# Patient Record
Sex: Female | Born: 1970 | Race: White | Hispanic: No | Marital: Married | Smoking: Current every day smoker
Health system: Southern US, Community
[De-identification: ages and names within clinical notes are randomized; demographics above are authoritative.]

## PROBLEM LIST (undated history)

## (undated) DIAGNOSIS — M5430 Sciatica, unspecified side: Secondary | ICD-10-CM

## (undated) HISTORY — PX: CHOLECYSTECTOMY: SHX55

---

## 2000-12-01 ENCOUNTER — Inpatient Hospital Stay (HOSPITAL_COMMUNITY): Admission: AD | Admit: 2000-12-01 | Discharge: 2000-12-01 | Payer: Self-pay | Admitting: Obstetrics & Gynecology

## 2003-08-28 ENCOUNTER — Emergency Department (HOSPITAL_COMMUNITY): Admission: EM | Admit: 2003-08-28 | Discharge: 2003-08-28 | Payer: Self-pay | Admitting: Emergency Medicine

## 2003-08-29 ENCOUNTER — Emergency Department (HOSPITAL_COMMUNITY): Admission: EM | Admit: 2003-08-29 | Discharge: 2003-08-30 | Payer: Self-pay | Admitting: Emergency Medicine

## 2005-03-26 ENCOUNTER — Emergency Department (HOSPITAL_COMMUNITY): Admission: EM | Admit: 2005-03-26 | Discharge: 2005-03-27 | Payer: Self-pay | Admitting: Emergency Medicine

## 2005-09-15 ENCOUNTER — Emergency Department (HOSPITAL_COMMUNITY): Admission: EM | Admit: 2005-09-15 | Discharge: 2005-09-15 | Payer: Self-pay | Admitting: Emergency Medicine

## 2009-01-13 ENCOUNTER — Emergency Department (HOSPITAL_COMMUNITY): Admission: EM | Admit: 2009-01-13 | Discharge: 2009-01-13 | Payer: Self-pay | Admitting: Emergency Medicine

## 2009-07-12 ENCOUNTER — Emergency Department (HOSPITAL_COMMUNITY): Admission: EM | Admit: 2009-07-12 | Discharge: 2009-07-12 | Payer: Self-pay | Admitting: Emergency Medicine

## 2011-01-02 ENCOUNTER — Emergency Department (HOSPITAL_COMMUNITY): Payer: Self-pay

## 2011-01-02 ENCOUNTER — Encounter (HOSPITAL_COMMUNITY): Payer: Self-pay

## 2011-01-02 ENCOUNTER — Emergency Department (HOSPITAL_COMMUNITY)
Admission: EM | Admit: 2011-01-02 | Discharge: 2011-01-02 | Disposition: A | Discharge status: Home / Self Care | Payer: Self-pay | Attending: Emergency Medicine | Admitting: Emergency Medicine

## 2011-01-02 DIAGNOSIS — X58XXXA Exposure to other specified factors, initial encounter: Secondary | ICD-10-CM | POA: Insufficient documentation

## 2011-01-02 DIAGNOSIS — N200 Calculus of kidney: Secondary | ICD-10-CM | POA: Insufficient documentation

## 2011-01-02 DIAGNOSIS — M549 Dorsalgia, unspecified: Secondary | ICD-10-CM | POA: Insufficient documentation

## 2011-01-02 DIAGNOSIS — R109 Unspecified abdominal pain: Secondary | ICD-10-CM | POA: Insufficient documentation

## 2011-01-02 DIAGNOSIS — S335XXA Sprain of ligaments of lumbar spine, initial encounter: Secondary | ICD-10-CM | POA: Insufficient documentation

## 2011-01-02 DIAGNOSIS — R3129 Other microscopic hematuria: Secondary | ICD-10-CM | POA: Insufficient documentation

## 2011-01-02 LAB — URINE MICROSCOPIC-ADD ON

## 2011-01-02 LAB — URINALYSIS, ROUTINE W REFLEX MICROSCOPIC
Bilirubin Urine: NEGATIVE
Glucose, UA: NEGATIVE mg/dL
Ketones, ur: NEGATIVE mg/dL
Nitrite: NEGATIVE
Protein, ur: NEGATIVE mg/dL
Specific Gravity, Urine: 1.025 (ref 1.005–1.030)
Urobilinogen, UA: 0.2 mg/dL (ref 0.0–1.0)
pH: 6 (ref 5.0–8.0)

## 2011-01-22 ENCOUNTER — Emergency Department (HOSPITAL_COMMUNITY)
Admission: EM | Admit: 2011-01-22 | Discharge: 2011-01-23 | Disposition: A | Discharge status: Home / Self Care | Payer: PRIVATE HEALTH INSURANCE | Attending: Emergency Medicine | Admitting: Emergency Medicine

## 2011-01-22 ENCOUNTER — Emergency Department (HOSPITAL_COMMUNITY): Payer: PRIVATE HEALTH INSURANCE

## 2011-01-22 DIAGNOSIS — M545 Low back pain, unspecified: Secondary | ICD-10-CM | POA: Insufficient documentation

## 2011-01-22 DIAGNOSIS — M5430 Sciatica, unspecified side: Secondary | ICD-10-CM | POA: Insufficient documentation

## 2011-01-22 DIAGNOSIS — N390 Urinary tract infection, site not specified: Secondary | ICD-10-CM | POA: Insufficient documentation

## 2011-01-22 DIAGNOSIS — N200 Calculus of kidney: Secondary | ICD-10-CM | POA: Insufficient documentation

## 2011-01-22 LAB — URINALYSIS, ROUTINE W REFLEX MICROSCOPIC
Bilirubin Urine: NEGATIVE
Glucose, UA: NEGATIVE mg/dL
Ketones, ur: NEGATIVE mg/dL
Nitrite: NEGATIVE
Protein, ur: NEGATIVE mg/dL
Specific Gravity, Urine: 1.021 (ref 1.005–1.030)
Urobilinogen, UA: 0.2 mg/dL (ref 0.0–1.0)
pH: 6 (ref 5.0–8.0)

## 2011-01-22 LAB — URINE MICROSCOPIC-ADD ON

## 2011-01-23 LAB — URINE CULTURE
Colony Count: 6000
Culture  Setup Time: 201204180121

## 2011-08-16 ENCOUNTER — Emergency Department (HOSPITAL_BASED_OUTPATIENT_CLINIC_OR_DEPARTMENT_OTHER)
Admission: EM | Admit: 2011-08-16 | Discharge: 2011-08-17 | Disposition: A | Discharge status: Home / Self Care | Payer: Self-pay | Attending: Emergency Medicine | Admitting: Emergency Medicine

## 2011-08-16 ENCOUNTER — Emergency Department (INDEPENDENT_AMBULATORY_CARE_PROVIDER_SITE_OTHER): Payer: Self-pay

## 2011-08-16 ENCOUNTER — Encounter (HOSPITAL_BASED_OUTPATIENT_CLINIC_OR_DEPARTMENT_OTHER): Payer: Self-pay | Admitting: *Deleted

## 2011-08-16 DIAGNOSIS — M549 Dorsalgia, unspecified: Secondary | ICD-10-CM | POA: Insufficient documentation

## 2011-08-16 DIAGNOSIS — N200 Calculus of kidney: Secondary | ICD-10-CM

## 2011-08-16 DIAGNOSIS — R109 Unspecified abdominal pain: Secondary | ICD-10-CM | POA: Insufficient documentation

## 2011-08-16 DIAGNOSIS — N1330 Unspecified hydronephrosis: Secondary | ICD-10-CM

## 2011-08-16 DIAGNOSIS — N201 Calculus of ureter: Secondary | ICD-10-CM

## 2011-08-16 DIAGNOSIS — R112 Nausea with vomiting, unspecified: Secondary | ICD-10-CM

## 2011-08-16 HISTORY — DX: Sciatica, unspecified side: M54.30

## 2011-08-16 LAB — URINALYSIS, ROUTINE W REFLEX MICROSCOPIC
Glucose, UA: NEGATIVE mg/dL
Ketones, ur: 15 mg/dL — AB
Leukocytes, UA: NEGATIVE
Nitrite: NEGATIVE
Protein, ur: 30 mg/dL — AB
Specific Gravity, Urine: 1.028 (ref 1.005–1.030)
Urobilinogen, UA: 0.2 mg/dL (ref 0.0–1.0)
pH: 5.5 (ref 5.0–8.0)

## 2011-08-16 LAB — URINE MICROSCOPIC-ADD ON

## 2011-08-16 LAB — PREGNANCY, URINE: Preg Test, Ur: NEGATIVE

## 2011-08-16 MED ORDER — KETOROLAC TROMETHAMINE 30 MG/ML IJ SOLN
30.0000 mg | Freq: Once | INTRAMUSCULAR | Status: AC
  Administered 2011-08-16: 30 mg via INTRAVENOUS
  Filled 2011-08-16: qty 1

## 2011-08-16 MED ORDER — SODIUM CHLORIDE 0.9 % IV SOLN
Freq: Once | INTRAVENOUS | Status: AC
  Administered 2011-08-16: 1000 mL via INTRAVENOUS

## 2011-08-16 MED ORDER — ONDANSETRON HCL 4 MG/2ML IJ SOLN
4.0000 mg | Freq: Once | INTRAMUSCULAR | Status: AC
  Administered 2011-08-16: 4 mg via INTRAVENOUS
  Filled 2011-08-16: qty 2

## 2011-08-16 MED ORDER — HYDROMORPHONE HCL PF 1 MG/ML IJ SOLN
INTRAMUSCULAR | Status: AC
  Administered 2011-08-16: 1 mg
  Filled 2011-08-16: qty 1

## 2011-08-16 MED ORDER — TAMSULOSIN HCL 0.4 MG PO CAPS: 0.4000 mg | ORAL_CAPSULE | Freq: Every day | ORAL | Status: DC

## 2011-08-16 MED ORDER — HYDROMORPHONE HCL PF 1 MG/ML IJ SOLN
1.0000 mg | Freq: Once | INTRAMUSCULAR | Status: AC
  Administered 2011-08-16: 1 mg via INTRAVENOUS
  Filled 2011-08-16: qty 1

## 2011-08-16 MED ORDER — OXYCODONE-ACETAMINOPHEN 5-325 MG PO TABS: 2.0000 | ORAL_TABLET | ORAL | Status: AC | PRN

## 2011-08-16 NOTE — ED Provider Notes (Signed)
History     CSN: [id number] Arrival date & time: 08/16/2011  6:21 PM   First MD Initiated Contact with Patient 08/16/11 1836      Chief Complaint  Patient presents with  . Abdominal Pain    (Consider location/radiation/quality/duration/timing/severity/associated sxs/prior treatment) Patient is a 40 y.o. female presenting with abdominal pain. The history is provided by the patient. No language interpreter was used.  Abdominal Pain The primary symptoms of the illness include abdominal pain. The current episode started 3 to 5 hours ago. The onset of the illness was sudden. The problem has been gradually worsening.  Associated with: previous kidney stone. The patient states that she believes she is currently not pregnant. The patient has not had a change in bowel habit. Additional symptoms associated with the illness include back pain. Symptoms associated with the illness do not include chills or constipation. Significant associated medical issues do not include PUD or inflammatory bowel disease.    Past Medical History  Diagnosis Date  . Sciatica     Past Surgical History  Procedure Date  . Cholecystectomy     No family history on file.  History  Substance Use Topics  . Smoking status: Current Everyday Smoker  . Smokeless tobacco: Not on file  . Alcohol Use: No    OB History    Grav Para Term Preterm Abortions TAB SAB Ect Mult Living                  Review of Systems  Constitutional: Negative for chills.  Gastrointestinal: Positive for abdominal pain. Negative for constipation.  Musculoskeletal: Positive for back pain.  All other systems reviewed and are negative.    Allergies  Review of patient's allergies indicates no known allergies.  Home Medications   Current Outpatient Rx  Name Route Sig Dispense Refill  . IBUPROFEN 200 MG PO TABS Oral Take 400 mg by mouth every 6 (six) hours as needed. For pain       BP 122/88  Pulse 74  Temp(Src) 98.3 F (36.8  C) (Oral)  Resp 22  SpO2 97%  Physical Exam  Nursing note and vitals reviewed. Constitutional: She appears well-developed and well-nourished.  HENT:  Head: Normocephalic and atraumatic.  Right Ear: External ear normal.  Left Ear: External ear normal.  Mouth/Throat: Oropharynx is clear and moist.  Eyes: Conjunctivae are normal. Pupils are equal, round, and reactive to light.  Neck: Normal range of motion.  Cardiovascular: Normal rate.   Pulmonary/Chest: Effort normal.  Abdominal: Soft.  Musculoskeletal: Normal range of motion.  Neurological: She is alert.  Skin: Skin is warm.  Psychiatric: She has a normal mood and affect.    ED Course  Procedures (including critical care time)  Labs Reviewed  URINALYSIS, ROUTINE W REFLEX MICROSCOPIC - Abnormal; Notable for the following:    Color, Urine AMBER (*) BIOCHEMICALS MAY BE AFFECTED BY COLOR   Appearance CLOUDY (*)    Hgb urine dipstick LARGE (*)    Bilirubin Urine SMALL (*)    Ketones, ur 15 (*)    Protein, ur 30 (*)    All other components within normal limits  PREGNANCY, URINE  URINE MICROSCOPIC-ADD ON   No results found.   No diagnosis found.    MDM  Pt given IV dilaudid x 3.  Urine shows large blood,  preg test negative    Results for orders placed during the hospital encounter of 08/16/11  URINALYSIS, ROUTINE W REFLEX MICROSCOPIC  Component Value Range   Color, Urine AMBER (*) YELLOW    Appearance CLOUDY (*) CLEAR    Specific Gravity, Urine 1.028  1.005 - 1.030    pH 5.5  5.0 - 8.0    Glucose, UA NEGATIVE  NEGATIVE (mg/dL)   Hgb urine dipstick LARGE (*) NEGATIVE    Bilirubin Urine SMALL (*) NEGATIVE    Ketones, ur 15 (*) NEGATIVE (mg/dL)   Protein, ur 30 (*) NEGATIVE (mg/dL)   Urobilinogen, UA 0.2  0.0 - 1.0 (mg/dL)   Nitrite NEGATIVE  NEGATIVE    Leukocytes, UA NEGATIVE  NEGATIVE   PREGNANCY, URINE      Component Value Range   Preg Test, Ur NEGATIVE    URINE MICROSCOPIC-ADD ON      Component  Value Range   Squamous Epithelial / LPF RARE  RARE    RBC / HPF TOO NUMEROUS TO COUNT  <3 (RBC/hpf)   Bacteria, UA RARE  RARE    Urine-Other MUCOUS PRESENT     Ct Abdomen Pelvis Wo Contrast  08/16/2011  *RADIOLOGY REPORT*  Clinical Data: Lambert Mody lower abdominal pain, nausea/vomiting, history of kidney stones  CT ABDOMEN AND PELVIS WITHOUT CONTRAST  Technique:  Multidetector CT imaging of the abdomen and pelvis was performed following the standard protocol without intravenous contrast.  Comparison: 01/22/2011  Findings: Lung bases are clear.  Unenhanced liver, spleen, pancreas, and adrenal glands within normal limits.  Status post cholecystectomy.  No intrahepatic or extrahepatic ductal dilatation.  Left perirenal edema with perinephric stranding and mild hydronephrosis.  Right kidney is unremarkable.  No renal calculi are seen.  No evidence of bowel obstruction.  Normal appendix.  No evidence of abdominal aortic aneurysm.  No abdominopelvic ascites.  No suspicious abdominopelvic lymphadenopathy.  Uterus is notable for a T-shaped IUD in satisfactory position. Bilateral ovaries are unremarkable.  3 mm calculus in the distal left ureter (series 2/image 83). Bladder is unremarkable.  Fat-containing right inguinal hernia.  Mild degenerative changes of the visualized thoracolumbar spine.  IMPRESSION: 3 mm obstructing left distal ureteral calculus.  Mild left hydronephrosis with perinephric stranding.  Original Report Authenticated By: Charline Bills, M.D.    Pt counseled on kidney stone.  Pt given torodol,  Rx for percocet and flomax.   Pt advised to follow up with her urologist.   Langston Masker, PA 08/16/11 2120  Langston Masker, [place] 08/16/11 2157

## 2011-08-16 NOTE — ED Notes (Signed)
Pt c/o sharp lower abd pain that radiates around to her back. Pt sts pain began at 1600hrs today. Pt sts nausea and diarrhea on and off since Monday.

## 2011-08-17 NOTE — ED Provider Notes (Signed)
Medical screening examination/treatment/procedure(s) were performed by non-physician practitioner and as supervising physician I was immediately available for consultation/collaboration.   Lenoir Facchini, MD 08/17/11 0048 

## 2012-07-12 IMAGING — CT CT ABD-PELV W/O CM
2 of 4 series · 17 of 46 positions shown, 19 images · non-contrast
Comparison: 01/22/2011

CLINICAL DATA: Sharp lower abdominal pain, nausea/vomiting, history
of kidney stones

CT ABDOMEN AND PELVIS WITHOUT CONTRAST
TECHNIQUE: Multidetector CT imaging of the abdomen and pelvis was
performed following the standard protocol without intravenous
contrast.

[Series 2: abd/pelvis 5.0 b31f · axial · 0.97mm/px · z∈[-522,-52]mm · 14 of 104 slices shown, 16 images]
[im 5/104  soft-tissue]
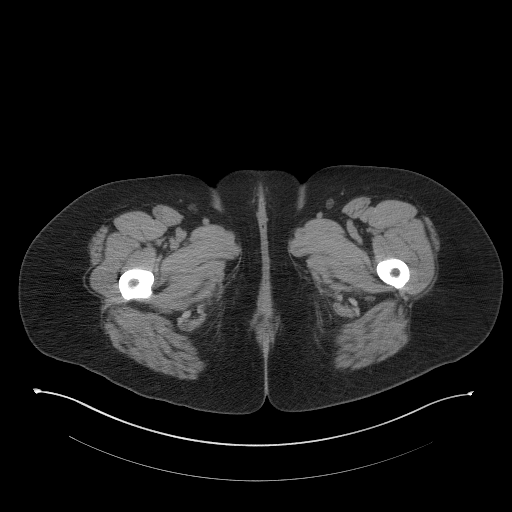
[im 5/104  bone]
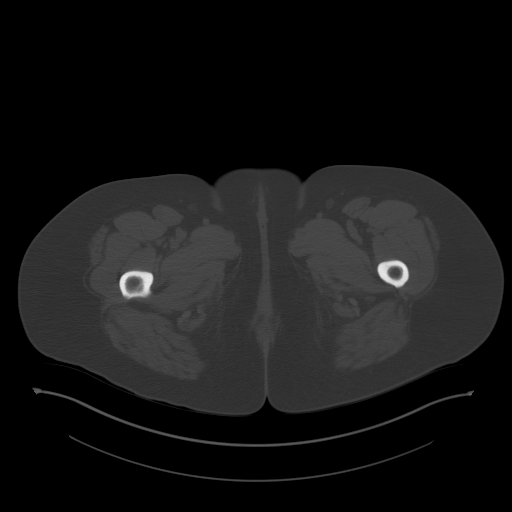
[im 14/104  soft-tissue]
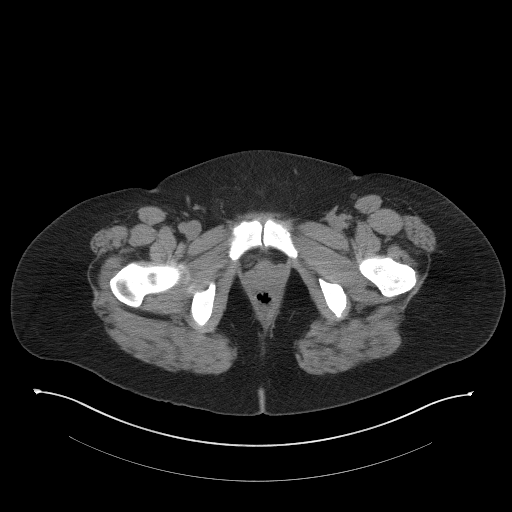
[im 18/104  soft-tissue]
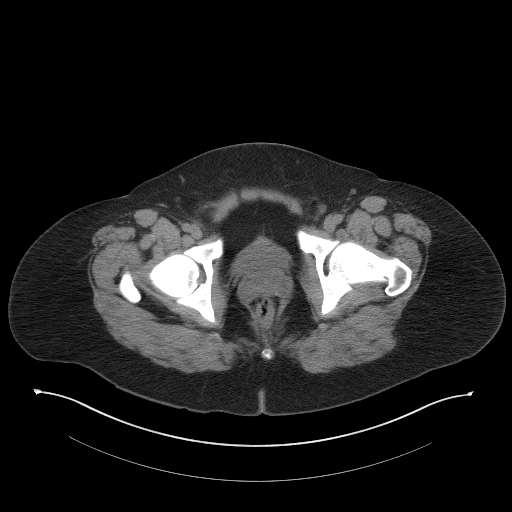
[im 27/104  soft-tissue]
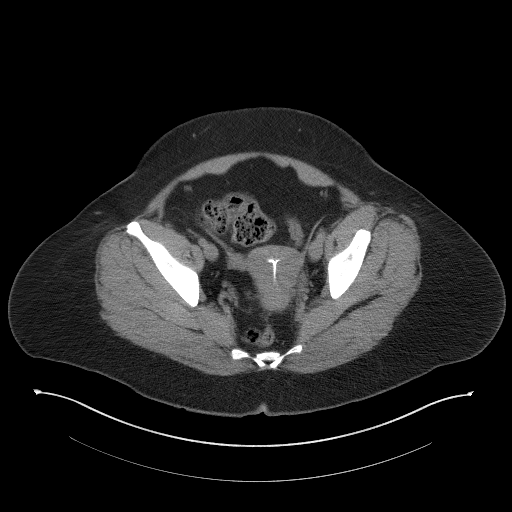
[im 36/104  soft-tissue]
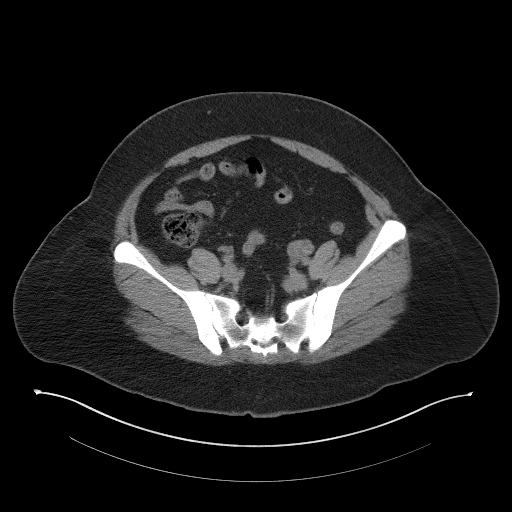
[im 41/104  soft-tissue]
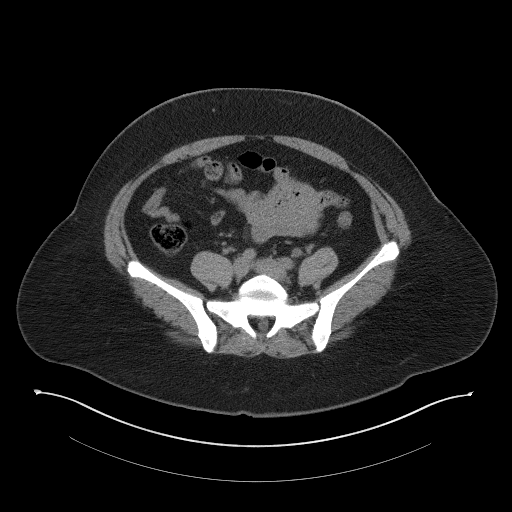
[im 50/104  soft-tissue]
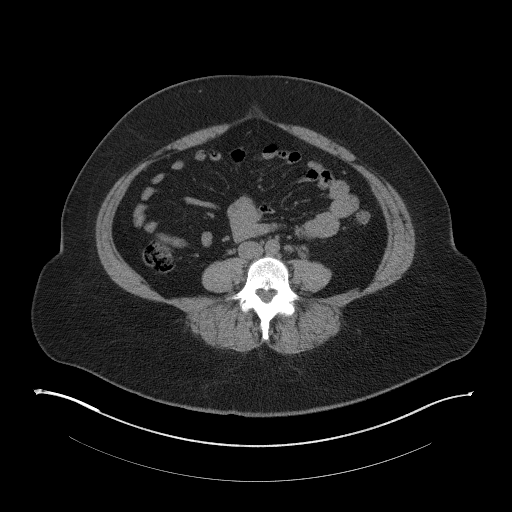
[im 54/104  soft-tissue]
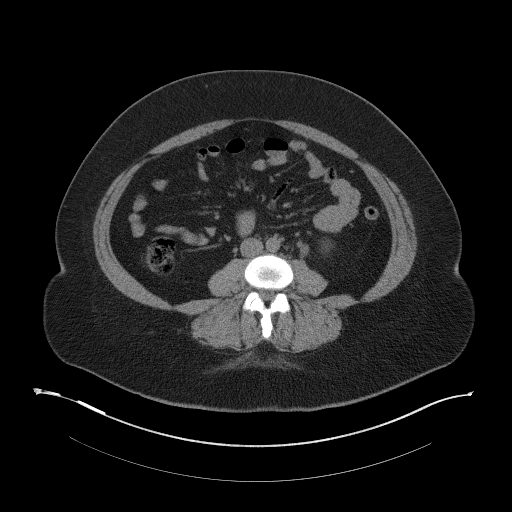
[im 63/104  soft-tissue]
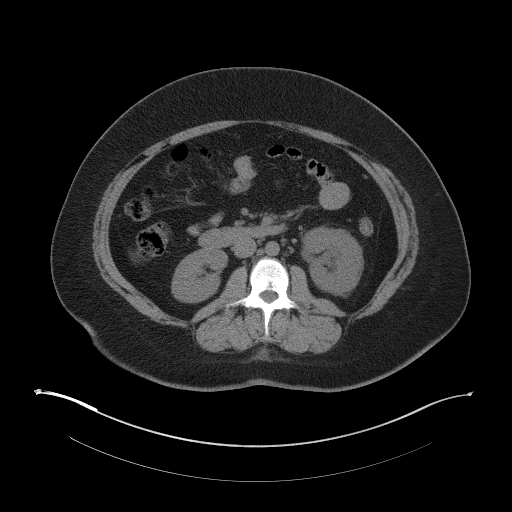
[im 63/104  bone]
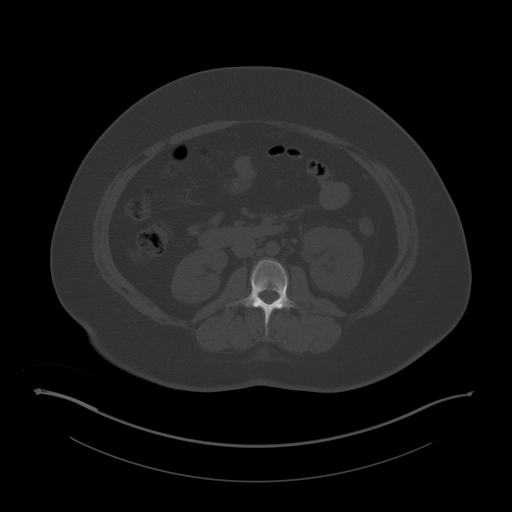
[im 68/104  soft-tissue]
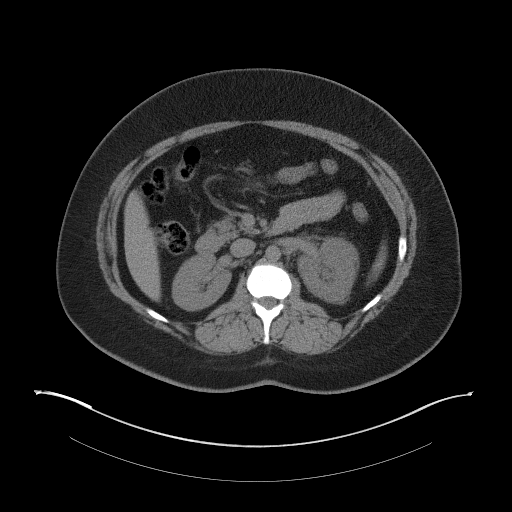
[im 77/104  soft-tissue]
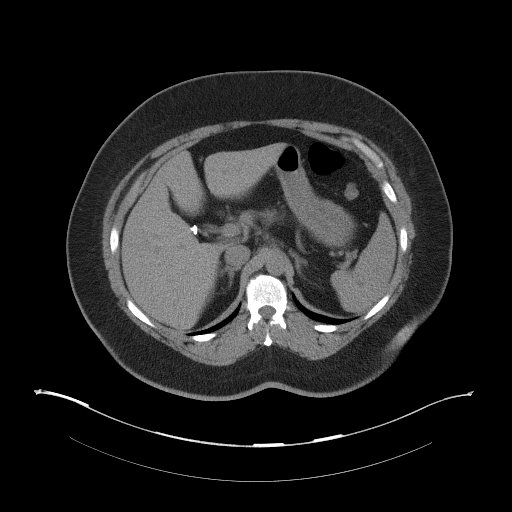
[im 86/104  soft-tissue]
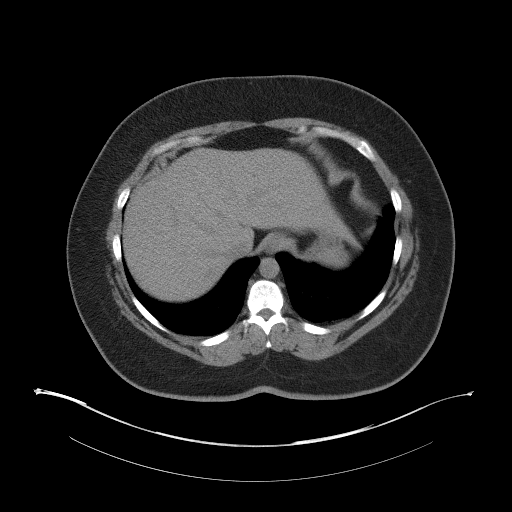
[im 90/104  soft-tissue]
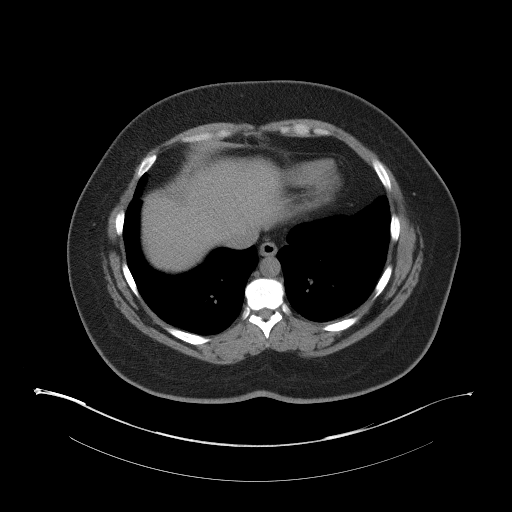
[im 99/104  soft-tissue]
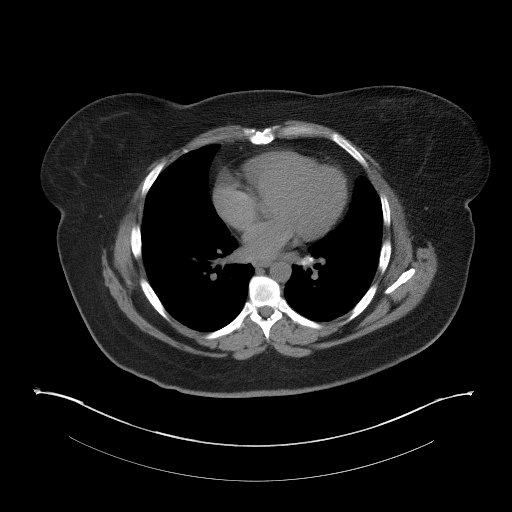

[Series 5: abd/pelvis 3.0 coronal · coronal · 0.99mm/px · 3 of 110 slices shown]
[im 37/110  soft-tissue]
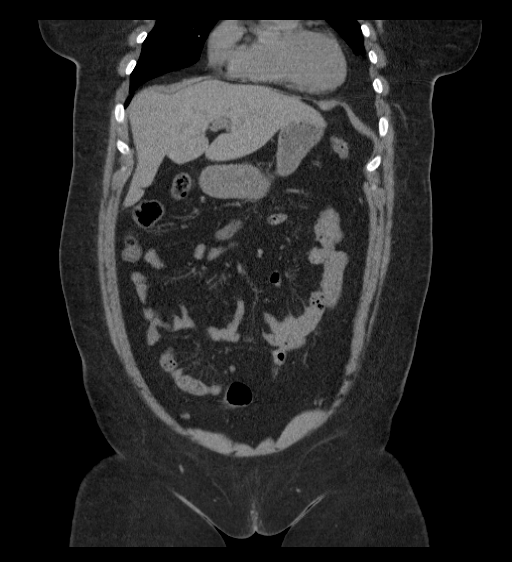
[im 49/110  soft-tissue]
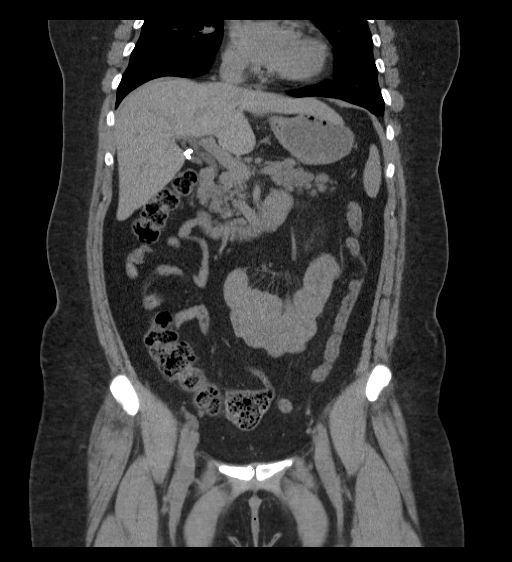
[im 61/110  soft-tissue]
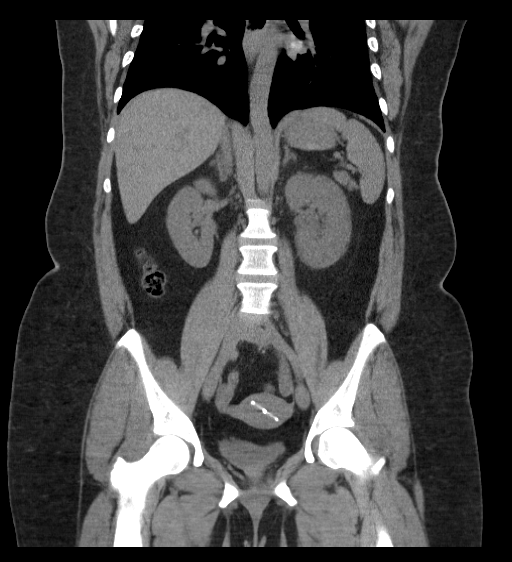

[17 of 46 positions shown; findings below may reference images not displayed]

FINDINGS: Lung bases are clear.

Unenhanced liver, spleen, pancreas, and adrenal glands within
normal limits.

Status post cholecystectomy.  No intrahepatic or extrahepatic
ductal dilatation.

Left perirenal edema with perinephric stranding and mild
hydronephrosis.  Right kidney is unremarkable.  No renal calculi
are seen.

No evidence of bowel obstruction.  Normal appendix.

No evidence of abdominal aortic aneurysm.

No abdominopelvic ascites.

No suspicious abdominopelvic lymphadenopathy.

Uterus is notable for a T-shaped IUD in satisfactory position.
Bilateral ovaries are unremarkable.

3 mm calculus in the distal left ureter (series 2/image 83).
Bladder is unremarkable.

Fat-containing right inguinal hernia.

Mild degenerative changes of the visualized thoracolumbar spine.
IMPRESSION: 3 mm obstructing left distal ureteral calculus.  Mild left
hydronephrosis with perinephric stranding.

## 2013-02-16 ENCOUNTER — Emergency Department (HOSPITAL_BASED_OUTPATIENT_CLINIC_OR_DEPARTMENT_OTHER)
Admission: EM | Admit: 2013-02-16 | Discharge: 2013-02-16 | Disposition: A | Discharge status: Home / Self Care | Payer: 59 | Attending: Emergency Medicine | Admitting: Emergency Medicine

## 2013-02-16 ENCOUNTER — Encounter (HOSPITAL_BASED_OUTPATIENT_CLINIC_OR_DEPARTMENT_OTHER): Payer: Self-pay | Admitting: *Deleted

## 2013-02-16 DIAGNOSIS — Z8739 Personal history of other diseases of the musculoskeletal system and connective tissue: Secondary | ICD-10-CM | POA: Insufficient documentation

## 2013-02-16 DIAGNOSIS — F17200 Nicotine dependence, unspecified, uncomplicated: Secondary | ICD-10-CM | POA: Insufficient documentation

## 2013-02-16 DIAGNOSIS — M545 Low back pain, unspecified: Secondary | ICD-10-CM | POA: Insufficient documentation

## 2013-02-16 DIAGNOSIS — M549 Dorsalgia, unspecified: Secondary | ICD-10-CM

## 2013-02-16 DIAGNOSIS — Z3202 Encounter for pregnancy test, result negative: Secondary | ICD-10-CM | POA: Insufficient documentation

## 2013-02-16 LAB — URINALYSIS, ROUTINE W REFLEX MICROSCOPIC
Bilirubin Urine: NEGATIVE
Glucose, UA: NEGATIVE mg/dL
Ketones, ur: NEGATIVE mg/dL
Nitrite: NEGATIVE
Protein, ur: NEGATIVE mg/dL
Specific Gravity, Urine: 1.026 (ref 1.005–1.030)
Urobilinogen, UA: 1 mg/dL (ref 0.0–1.0)
pH: 6 (ref 5.0–8.0)

## 2013-02-16 LAB — URINE MICROSCOPIC-ADD ON

## 2013-02-16 LAB — PREGNANCY, URINE: Preg Test, Ur: NEGATIVE

## 2013-02-16 MED ORDER — DIAZEPAM 5 MG PO TABS: 5.0000 mg | ORAL_TABLET | Freq: Two times a day (BID) | ORAL | Status: DC

## 2013-02-16 MED ORDER — KETOROLAC TROMETHAMINE 30 MG/ML IJ SOLN
30.0000 mg | Freq: Once | INTRAMUSCULAR | Status: AC
  Administered 2013-02-16: 30 mg via INTRAMUSCULAR
  Filled 2013-02-16: qty 1

## 2013-02-16 MED ORDER — HYDROCODONE-ACETAMINOPHEN 5-325 MG PO TABS: 1.0000 | ORAL_TABLET | ORAL | Status: DC | PRN

## 2013-02-16 MED ORDER — DIAZEPAM 5 MG PO TABS
10.0000 mg | ORAL_TABLET | Freq: Once | ORAL | Status: AC
  Administered 2013-02-16: 10 mg via ORAL
  Filled 2013-02-16: qty 2

## 2013-02-16 NOTE — ED Notes (Signed)
Waiting for husband to arrive before giving Valium to verify that pt has a ride home.

## 2013-02-16 NOTE — ED Provider Notes (Signed)
Medical screening examination/treatment/procedure(s) were performed by non-physician practitioner and as supervising physician I was immediately available for consultation/collaboration.  Jaret Coppedge R. Johnathyn Viscomi, MD 02/16/13 2326 

## 2013-02-16 NOTE — ED Provider Notes (Signed)
History     CSN: [id number]  Arrival date & time 02/16/13  1806   First MD Initiated Contact with Patient 02/16/13 1820      Chief Complaint  Patient presents with  . Back Pain    (Consider location/radiation/quality/duration/timing/severity/associated sxs/prior treatment) HPI Comments: Pt state that she has a history of back problems and it flares intermittently:pt states that she seems to be having a flare up again  Patient is a 42 y.o. female presenting with back pain. The history is provided by the patient. No language interpreter was used.  Back Pain Location:  Lumbar spine Quality:  Aching Radiates to: left leg. Pain severity:  Moderate Pain is:  Same all the time Onset quality:  Unable to specify Duration:  2 days Timing:  Constant Progression:  Unchanged Relieved by:  Nothing Worsened by:  Movement Ineffective treatments:  NSAIDs Associated symptoms: no bowel incontinence, no dysuria, no fever, no numbness, no pelvic pain, no perianal numbness and no weakness     Past Medical History  Diagnosis Date  . Sciatica     Past Surgical History  Procedure Laterality Date  . Cholecystectomy      History reviewed. No pertinent family history.  History  Substance Use Topics  . Smoking status: Current Every Day Smoker  . Smokeless tobacco: Not on file  . Alcohol Use: No    OB History   Grav Para Term Preterm Abortions TAB SAB Ect Mult Living                  Review of Systems  Constitutional: Negative for fever.  Respiratory: Negative.   Cardiovascular: Negative.   Gastrointestinal: Negative for bowel incontinence.  Genitourinary: Negative for dysuria and pelvic pain.  Musculoskeletal: Positive for back pain.  Neurological: Negative for weakness and numbness.    Allergies  Review of patient's allergies indicates no known allergies.  Home Medications   Current Outpatient Rx  Name  Route  Sig  Dispense  Refill  . ibuprofen (ADVIL,MOTRIN) 200 MG  tablet   Oral   Take 400 mg by mouth every 6 (six) hours as needed. For pain          . Tamsulosin HCl (FLOMAX) 0.4 MG CAPS   Oral   Take 1 capsule (0.4 mg total) by mouth daily after breakfast.   14 capsule   0     BP 142/88  Pulse 113  Temp(Src) 99 F (37.2 C) (Oral)  Resp 20  Wt 205 lb (92.987 kg)  SpO2 96%  Physical Exam  Nursing note and vitals reviewed. Constitutional: She is oriented to person, place, and time. She appears well-developed and well-nourished.  HENT:  Head: Normocephalic and atraumatic.  Cardiovascular: Normal rate and regular rhythm.   Pulmonary/Chest: Effort normal and breath sounds normal.  Musculoskeletal:  Lumbar paraspinal tenderness:pt able to do straight leg raise and moving all extremities without any problem  Neurological: She is oriented to person, place, and time.  Skin: Skin is warm and dry.  Psychiatric: She has a normal mood and affect.    ED Course  Procedures (including critical care time)  Labs Reviewed  URINALYSIS, ROUTINE W REFLEX MICROSCOPIC - Abnormal; Notable for the following:    APPearance CLOUDY (*)    Hgb urine dipstick SMALL (*)    Leukocytes, UA SMALL (*)    All other components within normal limits  URINE MICROSCOPIC-ADD ON - Abnormal; Notable for the following:    Squamous Epithelial / LPF FEW (*)  Bacteria, UA FEW (*)    All other components within normal limits  PREGNANCY, URINE   No results found.   1. Back pain       MDM  Will treat symptomatically with vicodin and valium and give referral to dr. Pearletha Forge for continued symptoms        Teressa Lower, NP 02/16/13 [phone]

## 2013-02-16 NOTE — ED Notes (Signed)
Lower back pain. Hx of same on and off for a year.

## 2013-02-22 ENCOUNTER — Ambulatory Visit (INDEPENDENT_AMBULATORY_CARE_PROVIDER_SITE_OTHER): Payer: 59 | Admitting: Family Medicine

## 2013-02-22 ENCOUNTER — Encounter: Payer: Self-pay | Admitting: Family Medicine

## 2013-02-22 VITALS — BP 127/89 | HR 112 | Ht 63.0 in | Wt 205.0 lb

## 2013-02-22 DIAGNOSIS — M545 Low back pain: Secondary | ICD-10-CM

## 2013-02-22 MED ORDER — HYDROCODONE-ACETAMINOPHEN 5-325 MG PO TABS: 1.0000 | ORAL_TABLET | Freq: Four times a day (QID) | ORAL | Status: DC | PRN

## 2013-02-22 MED ORDER — PREDNISONE (PAK) 10 MG PO TABS: ORAL_TABLET | ORAL | Status: DC

## 2013-02-22 MED ORDER — CYCLOBENZAPRINE HCL 10 MG PO TABS: 10.0000 mg | ORAL_TABLET | Freq: Three times a day (TID) | ORAL | Status: DC | PRN

## 2013-02-22 NOTE — Patient Instructions (Addendum)
You have lumbar radiculopathy (a pinched nerve in your low back). Start prednisone dose pack x 6 days. Day AFTER finishing prednisone start aleve 2 tabs twice a day with food for pain and inflammation. Norco as needed for severe pain (no driving on this medicine). Flexeril as needed for muscle spasms (no driving on this medicine if it makes you sleepy). Physical therapy and home exercises are the most important part of your treatment though - do home exercises on days you can't go to therapy. Strengthening of low back muscles, abdominal musculature are key for long term pain relief. If not improving, will consider further imaging (MRI). Follow up with me in 6 weeks for reevaluation.

## 2013-02-22 NOTE — Assessment & Plan Note (Signed)
consistent with a lumbar radiculopathy based on her history though has improved some since then.  Start prednisone and transition to aleve.  Norco, flexeril as needed.  Start formal PT and home exercise program.  F/u in 6 weeks.  Consider MRI if not improving.

## 2013-02-22 NOTE — Progress Notes (Addendum)
Subjective:    Patient ID: Dawn Pena, female    DOB: 10-13-1970, 42 y.o.   MRN: [id number]  PCP: None listed  HPI 42 yo F here for low back pain.  Patient has had issues for over two years with low back pain. Initially started when she slipped at work and fell causing low back pain. Had an x-ray  (01/02/11) that showed mild facet changes and mild disc narrowing at T11-12 but nothing acute. She's had constant very low level pain since then. Would take tylenol or ibuprofen if it really bothered her. About a week ago in middle of night her low back seized up on her when on the toilet. Went to ED again - given shot of toradol as well as norco and valium. Has improved since then. Experienced pain and weakness into left leg only with some mild numbness. Most of this has improved. No bowel/bladder dysfunction. Never did PT.  Past Medical History  Diagnosis Date  . Sciatica     Current Outpatient Prescriptions on File Prior to Visit  Medication Sig Dispense Refill  . ibuprofen (ADVIL,MOTRIN) 200 MG tablet Take 400 mg by mouth every 6 (six) hours as needed. For pain       . Tamsulosin HCl (FLOMAX) 0.4 MG CAPS Take 1 capsule (0.4 mg total) by mouth daily after breakfast.  14 capsule  0   No current facility-administered medications on file prior to visit.    Past Surgical History  Procedure Laterality Date  . Cholecystectomy      No Known Allergies  History   Social History  . Marital Status: Married    Spouse Name: N/A    Number of Children: N/A  . Years of Education: N/A   Occupational History  . Not on file.   Social History Main Topics  . Smoking status: Current Every Day Smoker  . Smokeless tobacco: Not on file  . Alcohol Use: No  . Drug Use: No  . Sexually Active: Not on file   Other Topics Concern  . Not on file   Social History Narrative  . No narrative on file    Family History  Problem Relation Age of Onset  . Hypertension Father   . Diabetes  Neg Hx   . Heart attack Neg Hx   . Hyperlipidemia Neg Hx   . Sudden death Neg Hx     BP 127/89  Pulse 112  Ht 5\' 3"  (1.6 m)  Wt 205 lb (92.987 kg)  BMI 36.32 kg/m2  Review of Systems See hPI above.    Objective:   Physical Exam Gen: NAD  Back: No gross deformity, scoliosis. TTP left lumbar paraspinal region, SI joint.  No midline or bony TTP.  No right sided tenderness. FROM without pain. Strength LEs 5/5 all muscle groups.   2+ MSRs in patellar and achilles tendons, equal bilaterally. Negative SLRs. Sensation intact to light touch bilaterally. Negative logroll bilateral hips Negative fabers and piriformis stretches. Leg lengths equal.    Assessment & Plan:  1. Low back pain - consistent with a lumbar radiculopathy based on her history though has improved some since then.  Start prednisone and transition to aleve.  Norco, flexeril as needed.  Start formal PT and home exercise program.  F/u in 6 weeks.  Consider MRI if not improving.  Addendum:  Patient called [place] stating she has a very high deductible.  Also stated her insurance requires her to have an MRI before she can get PT.  To my knowledge this is not true as the recommended course of action is PT then an MRI only if she fails PT and conservative treatment.  Our office called [employer] to ask if PT was required before an MRI and person there stated it is not required before an MRI.  We will order the MRI - however patient has been notified that this is not the usual course of action and it may be denied.

## 2013-02-23 ENCOUNTER — Ambulatory Visit: Payer: 59 | Admitting: Rehabilitation

## 2013-02-23 ENCOUNTER — Ambulatory Visit: Payer: PRIVATE HEALTH INSURANCE | Admitting: Family Medicine

## 2013-02-24 NOTE — Addendum Note (Signed)
Addended by: Lenda Kelp on: 02/24/2013 12:49 PM   Modules accepted: Orders

## 2013-02-27 ENCOUNTER — Ambulatory Visit (HOSPITAL_BASED_OUTPATIENT_CLINIC_OR_DEPARTMENT_OTHER): Payer: 59 | Attending: Family Medicine

## 2013-04-05 ENCOUNTER — Ambulatory Visit: Payer: 59 | Admitting: Family Medicine

## 2017-09-09 ENCOUNTER — Other Ambulatory Visit: Payer: Self-pay

## 2017-09-09 ENCOUNTER — Encounter (HOSPITAL_BASED_OUTPATIENT_CLINIC_OR_DEPARTMENT_OTHER): Payer: Self-pay | Admitting: Emergency Medicine

## 2017-09-09 ENCOUNTER — Emergency Department (HOSPITAL_BASED_OUTPATIENT_CLINIC_OR_DEPARTMENT_OTHER)
Admission: EM | Admit: 2017-09-09 | Discharge: 2017-09-09 | Disposition: A | Discharge status: Home / Self Care | Payer: Self-pay | Attending: Emergency Medicine | Admitting: Emergency Medicine

## 2017-09-09 DIAGNOSIS — G510 Bell's palsy: Secondary | ICD-10-CM | POA: Insufficient documentation

## 2017-09-09 MED ORDER — VALACYCLOVIR HCL 1 G PO TABS: 1000.0000 mg | ORAL_TABLET | Freq: Three times a day (TID) | ORAL | 0 refills | Status: DC

## 2017-09-09 MED ORDER — FLUORESCEIN SODIUM 1 MG OP STRP: 1.0000 | ORAL_STRIP | Freq: Once | OPHTHALMIC | Status: AC

## 2017-09-09 MED ORDER — VALACYCLOVIR HCL 1 G PO TABS: 1000.0000 mg | ORAL_TABLET | Freq: Three times a day (TID) | ORAL | 0 refills | Status: AC

## 2017-09-09 MED ORDER — PREDNISONE 20 MG PO TABS: ORAL_TABLET | ORAL | 0 refills | Status: DC

## 2017-09-09 MED ORDER — FLUORESCEIN SODIUM 0.6 MG OP STRP
ORAL_STRIP | OPHTHALMIC | Status: AC
  Administered 2017-09-09: 1 via OPHTHALMIC
  Filled 2017-09-09: qty 1

## 2017-09-09 MED FILL — valACYclovir HCL 1 GM TABS: 1 | 7 days supply | Qty: 21 | Fill #0

## 2017-09-09 MED FILL — predniSONE 20 MG TABS: 20 | 7 days supply | Qty: 21 | Fill #0

## 2017-09-09 NOTE — Discharge Instructions (Signed)
Follow up with neurology.  Return for worsening symptoms.  Take your meds as prescribed

## 2017-09-09 NOTE — ED Provider Notes (Signed)
[facility] EMERGENCY DEPARTMENT Provider Note   CSN: [id number] Arrival date & time: 09/09/17  4098     History   Chief Complaint Chief Complaint  Patient presents with  . Numbness    facial    HPI Dawn Pena is a 46 y.o. female.  46 yo F with a chief complaint of right-sided facial pain and right facial weakness.  This started a couple days ago with the pain.  She woke up this morning and noted that she had some right-sided facial droop.  Is having some difficulty with enunciating.  Feels that her right eye is irritated as well.  Denies fevers or chills.  Denies unilateral extremity weakness.   The history is provided by the patient.  Illness  This is a new problem. The current episode started yesterday. The problem occurs constantly. The problem has been gradually worsening. Pertinent negatives include no chest pain, no headaches and no shortness of breath. Nothing aggravates the symptoms. Nothing relieves the symptoms. She has tried nothing for the symptoms. The treatment provided no relief.    Past Medical History:  Diagnosis Date  . Sciatica     Patient Active Problem List   Diagnosis Date Noted  . Low back pain 02/22/2013    Past Surgical History:  Procedure Laterality Date  . CHOLECYSTECTOMY      OB History    No data available       Home Medications    Prior to Admission medications   Medication Sig Start Date End Date Taking? Authorizing Provider  ibuprofen (ADVIL,MOTRIN) 200 MG tablet Take 400 mg by mouth every 6 (six) hours as needed. For pain     [provider]  predniSONE (DELTASONE) 20 MG tablet 3 tabs po daily x 7 days 09/09/17   Melene Plan, DO  valACYclovir (VALTREX) 1000 MG tablet Take 1 tablet (1,000 mg total) by mouth 3 (three) times daily. 09/09/17   Melene Plan, DO    Family History Family History  Problem Relation Age of Onset  . Hypertension Father   . Diabetes Neg Hx   . Heart attack Neg Hx   . Hyperlipidemia  Neg Hx   . Sudden death Neg Hx     Social History Social History   Tobacco Use  . Smoking status: Current Every Day Smoker  . Smokeless tobacco: Never Used  Substance Use Topics  . Alcohol use: No  . Drug use: No     Allergies   Patient has no known allergies.   Review of Systems Review of Systems  Constitutional: Negative for chills and fever.  HENT: Positive for ear pain. Negative for congestion and rhinorrhea.   Eyes: Negative for redness and visual disturbance.  Respiratory: Negative for shortness of breath and wheezing.   Cardiovascular: Negative for chest pain and palpitations.  Gastrointestinal: Negative for nausea and vomiting.  Genitourinary: Negative for dysuria and urgency.  Musculoskeletal: Negative for arthralgias and myalgias.  Skin: Negative for pallor and wound.  Neurological: Positive for weakness (right facial). Negative for dizziness and headaches.     Physical Exam Updated Vital Signs BP (!) 123/93   Pulse 82   Temp 98.8 F (37.1 C)   Resp 19   Ht 5\' 2"  (1.575 m)   Wt 104.3 kg (230 lb)   SpO2 97%   BMI 42.07 kg/m   Physical Exam  Constitutional: She is oriented to person, place, and time. She appears well-developed and well-nourished. No distress.  HENT:  Head:  Normocephalic and atraumatic.  Vesicles are noted inside the right ear canal.  She has lesions on her nose which she states are chronic.  Eyes: EOM are normal. Pupils are equal, round, and reactive to light.  Very mild conjunctival injection to the right eye.  There is no fleuricein uptake noted.  No foreign bodies.  Neck: Normal range of motion. Neck supple.  Cardiovascular: Normal rate and regular rhythm. Exam reveals no gallop and no friction rub.  No murmur heard. Pulmonary/Chest: Effort normal. She has no wheezes. She has no rales.  Abdominal: Soft. She exhibits no distension and no mass. There is no tenderness. There is no guarding.  Musculoskeletal: She exhibits no edema or  tenderness.  Neurological: She is alert and oriented to person, place, and time. She has normal strength. A cranial nerve deficit is present. No sensory deficit. She displays a negative Romberg sign. Coordination and gait normal.  Right-sided facial droop including the forehead.  Skin: Skin is warm and dry. She is not diaphoretic.  Psychiatric: She has a normal mood and affect. Her behavior is normal.  Nursing note and vitals reviewed.    ED Treatments / Results  Labs (all labs ordered are listed, but only abnormal results are displayed) Labs Reviewed - No data to display  EKG  EKG Interpretation  Date/Time:  Tuesday September 09 2017 07:42:08 EST Ventricular Rate:  93 PR Interval:    QRS Duration: 75 QT Interval:  361 QTC Calculation: 449 R Axis:   19 Text Interpretation:  Sinus rhythm Low voltage, precordial leads No old tracing to compare Confirmed by Melene PlanFloyd, Tadao Emig [phone]) on 09/09/2017 8:43:02 AM       Radiology No results found.  Procedures Procedures (including critical care time)  Medications Ordered in ED Medications  fluorescein ophthalmic strip 1 strip (1 strip Right Eye Given 09/09/17 0919)     Initial Impression / Assessment and Plan / ED Course  I have reviewed the triage vital signs and the nursing notes.  Pertinent labs & imaging results that were available during my care of the patient were reviewed by me and considered in my medical decision making (see chart for details).     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  The patient also has some lesions to her nose though she states these are chronic.  She is having some eye irritation several stain and evaluate for ocular lesions.  This was negative.  I suspect this may be due to dry eye.  The patient is able to close her eye fully.  We will have her follow-up with neurology.  As well as her eye doctor.  9:48 AM:  I have discussed the diagnosis/risks/treatment options with the patient and believe the  pt to be eligible for discharge home to follow-up with PCP, neuro, ophtho. We also discussed returning to the ED immediately if new or worsening sx occur. We discussed the sx which are most concerning (e.g., sudden worsening pain, fever, inability to tolerate by mouth) that necessitate immediate return. Medications administered to the patient during their visit and any new prescriptions provided to the patient are listed below.  Medications given during this visit Medications  fluorescein ophthalmic strip 1 strip (1 strip Right Eye Given 09/09/17 0919)     The patient appears reasonably screen and/or stabilized for discharge and I doubt any other medical condition or other [facility] requiring further screening, evaluation, or treatment in the ED at this time prior to discharge.  Final Clinical Impressions(s) / ED Diagnoses   Final diagnoses:  Right-sided Bell's palsy    ED Discharge Orders        Ordered    predniSONE (DELTASONE) 20 MG tablet  Status:  Discontinued     09/09/17 0908    valACYclovir (VALTREX) 1000 MG tablet  3 times daily,   Status:  Discontinued     09/09/17 0908    Ambulatory referral to Neurology    Comments:  Bells palsy   09/09/17 0909    predniSONE (DELTASONE) 20 MG tablet     09/09/17 0913    valACYclovir (VALTREX) 1000 MG tablet  3 times daily     09/09/17 0913       Melene PlanFloyd, Benjaman Artman, DO 09/09/17 [phone]

## 2017-09-09 NOTE — ED Triage Notes (Addendum)
Right sided facial numbness since 6 am.  Last known normal last night.  Patient has had a headache for two days behind right ear.  No weakness in upper or lower extremities.  No fever.

## 2017-09-10 ENCOUNTER — Encounter: Payer: Self-pay | Admitting: Neurology

## 2017-09-10 ENCOUNTER — Ambulatory Visit: Payer: Self-pay | Admitting: Neurology

## 2017-09-10 VITALS — BP 124/92 | HR 102 | Ht 62.0 in | Wt 214.8 lb

## 2017-09-10 DIAGNOSIS — B0221 Postherpetic geniculate ganglionitis: Secondary | ICD-10-CM

## 2017-09-10 MED ORDER — GABAPENTIN 100 MG PO CAPS: 100.0000 mg | ORAL_CAPSULE | Freq: Three times a day (TID) | ORAL | 2 refills | Status: DC

## 2017-09-10 MED ORDER — GABAPENTIN 100 MG PO CAPS: 100.0000 mg | ORAL_CAPSULE | Freq: Three times a day (TID) | ORAL | 2 refills | Status: AC

## 2017-09-10 NOTE — Progress Notes (Signed)
NEUROLOGY CONSULTATION NOTE  Dawn Pena MRN: [id number] DOB: 1970/12/06  Referring provider: ED referral Primary care provider: no PCP  Reason for consult:  Ramsay-Hunt syndrome  HISTORY OF PRESENT ILLNESS: Dawn Pena is a 46 year old female who presents for right-sided Ramsay-Hunt syndrome.  History supplemented by ED note.  Last Thursday, she developed a shooting pain in her right ear.  Yesterday morning, she woke up and noted right sided facial droop with difficulty closing her right eye.  She denies numbness or change in hearing.  She denies vertigo.  She also exhibited vesicular rash in her right ear canal.  She presented to the ED where she was prescribed prednisone and valacyclovir.  PAST MEDICAL HISTORY: Past Medical History:  Diagnosis Date  . Sciatica     PAST SURGICAL HISTORY: Past Surgical History:  Procedure Laterality Date  . CHOLECYSTECTOMY      MEDICATIONS: Current Outpatient Medications on File Prior to Visit  Medication Sig Dispense Refill  . ibuprofen (ADVIL,MOTRIN) 200 MG tablet Take 400 mg by mouth every 6 (six) hours as needed. For pain     . predniSONE (DELTASONE) 20 MG tablet 3 tabs po daily x 7 days 21 tablet 0  . valACYclovir (VALTREX) 1000 MG tablet Take 1 tablet (1,000 mg total) by mouth 3 (three) times daily. 21 tablet 0   No current facility-administered medications on file prior to visit.     ALLERGIES: No Known Allergies  FAMILY HISTORY: Family History  Problem Relation Age of Onset  . Hypertension Father   . Hyperlipidemia Father   . Diabetes Neg Hx   . Heart attack Neg Hx   . Sudden death Neg Hx     SOCIAL HISTORY: Social History   Socioeconomic History  . Marital status: Married    Spouse name: Not on file  . Number of children: 2  . Years of education: Not on file  . Highest education level: Some college, no degree  Social Needs  . Financial resource strain: Not on file  . Food insecurity - worry: Not on file  .  Food insecurity - inability: Not on file  . Transportation needs - medical: Not on file  . Transportation needs - non-medical: Not on file  Occupational History  . Not on file  Tobacco Use  . Smoking status: Current Every Day Smoker  . Smokeless tobacco: Never Used  . Tobacco comment: 1/4 ppd  Substance and Sexual Activity  . Alcohol use: No  . Drug use: No  . Sexual activity: Not on file    Comment: mirenena  Other Topics Concern  . Not on file  Social History Narrative   Married, lives with husband and 2 children in a split level home. They have 2 dogs, 2 cats. She drinks hot tea most days.    REVIEW OF SYSTEMS: Constitutional: No fevers, chills, or sweats, no generalized fatigue, change in appetite Eyes: No visual changes, double vision, eye pain Ear, nose and throat: No hearing loss, ear pain, nasal congestion, sore throat Cardiovascular: No chest pain, palpitations Respiratory:  No shortness of breath at rest or with exertion, wheezes GastrointestinaI: No nausea, vomiting, diarrhea, abdominal pain, fecal incontinence Genitourinary:  No dysuria, urinary retention or frequency Musculoskeletal:  No neck pain, back pain Integumentary: No rash, pruritus, skin lesions Neurological: as above Psychiatric: No depression, insomnia, anxiety Endocrine: No palpitations, fatigue, diaphoresis, mood swings, change in appetite, change in weight, increased thirst Hematologic/Lymphatic:  No purpura, petechiae. Allergic/Immunologic: no itchy/runny eyes,  nasal congestion, recent allergic reactions, rashes  PHYSICAL EXAM: Vitals:   09/10/17 0953  BP: (!) 124/92  Pulse: (!) 102  SpO2: 97%   General: No acute distress.  Patient appears well-groomed.  Head:  Normocephalic/atraumatic Eyes:  fundi examined but not visualized Ears:  Vesicles in right external ear canal Neck: supple, no paraspinal tenderness, full range of motion Back: No paraspinal tenderness Heart: regular rate and  rhythm Lungs: Clear to auscultation bilaterally. Vascular: No carotid bruits. Neurological Exam: Mental status: alert and oriented to person, place, and time, recent and remote memory intact, fund of knowledge intact, attention and concentration intact, speech fluent and not dysarthric, language intact. Cranial nerves: CN I: not tested CN II: pupils equal, round and reactive to light, visual fields intact CN III, IV, VI:  full range of motion, no nystagmus, no ptosis CN V: facial sensation intact CN VII: right upper and lower facial weakness.  Unable to completely close right eye. CN VIII: hearing intact CN IX, X: gag intact, uvula midline CN XI: sternocleidomastoid and trapezius muscles intact CN XII: tongue midline Bulk & Tone: normal, no fasciculations. Motor:  5/5 throughout  Sensation: temperature and vibration sensation intact. Deep Tendon Reflexes:  2+ throughout, toes downgoing.  Finger to nose testing:  Without dysmetria.  Heel to shin:  Without dysmetria.  Gait:  Normal station and stride.  Able to turn but unsteady tandem gait. Romberg negative.  IMPRESSION: Right sided Ramsay Hunt syndrome  PLAN: 1.  Continue prednisone and valacylovir 2.  If ear pain persists, she will try gabapentin 100mg  three times daily. 3.  To protect cornea, advised to use saline eye drops during the day and lacri lube with eye patch at bedtime. 4.  Follow up with eye doctor 5.  Follow up in 2 to 3 months.  45 minutes spent face to face with patient, over 50% spent discussing diagnosis, prognosis and management.   Shon MilletAdam Yanci Bachtell, DO

## 2017-09-10 NOTE — Patient Instructions (Signed)
1.  Continue prednisone and valacylovir as prescribed. 2.  If ear pain persists, try gabapentin 100mg  three times daily 3.  For eye protection, use saline eye drops during the day.  At night, use a lubricant such as lacrilube and wear an eye patch to protect your cornea. 4.  Follow up with eye doctor  5.  Follow up with me in 2 to 3 months.  Ramsay Hunt Syndrome Ramsay Hunt syndrome (RHS) is a viral infection that affects the nerves in the face and the nerves near the inner ear. The infection is caused by the varicella zoster virus (VZV). This is the same virus that causes chicken pox and shingles. After a person has chicken pox, this virus may become inactive. Years later, the virus can become active again and cause Ramsay Hunt syndrome. The trigger may be something that weakens the body's defense system (immune system), like stress. When VZV becomes activated, it moves up the facial nerve and causes a painful rash in or around the ear canal. It may also travel up the nerve that supplies hearing. RHS cannot be passed from person to person (is not contagious). However, if a person who has never had chicken pox comes in contact with fluid from someone's skin blisters, he or she may develop chicken pox. What are the causes? This condition is caused by the varicella zoster virus. What increases the risk? You may be at risk for RHS if you have had chicken pox. What are the signs or symptoms? Usually, the first symptom of RHS is deep and severe pain in the affected ear. This is often followed by a rash with blisters that breaks out around the ear. The rash may go into the inner ear, along the side of the face, or up the scalp. Other symptoms may include:  A rash inside the mouth.  Severe, burning pain wherever the rash develops.  The main symptom of this condition is facial nerve weakness, which may cause:  Drooping on one side of the face.  Being unable to close the eyelid on the affected side  of the face.  Having trouble eating.  Losing the sense of taste on the side of the tongue.  If RHS affects the inner ear nerve (auditory nerve), other symptoms may be present, such as:  Hearing loss.  A spinning sensation (vertigo).  Clumsiness.  Ringing in the ear (tinnitus).  How is this diagnosed? Your health care provider may be able to diagnose RHS based on your signs and symptoms. You may also have tests to help your health care provider confirm the diagnosis. These tests may include:  Blood tests to check for antibodies to VZV. Antibodies are proteins that your body produces in response to germs.  An MRI.  Nerve conduction studies (electroneurogram).  Hearing tests (audiology).  How is this treated? RHS will run its course with or without treatment. If treatment starts within the first 3 days of having symptoms, it may shorten the course of RHS and prevent your facial nerve from continuing to weaken. Without treatment, it is possible that you may not recover full use of your facial nerve. You may have to take medicine, including:  An anti-inflammatory medicine called prednisone.  An antiviral medicine to treat the virus.  A prescription pain reliever to control pain.  Antibiotic medicine, if the rash becomes infected.  Follow these instructions at home:  Take over-the-counter and prescription medicines only as told by your health care provider.  If you were  prescribed an antibiotic medicine, take or apply it as told by your health care provider. Do not stop using the antibiotic even if your condition improves.  Do not drive or use heavy machinery while taking prescription pain medicine.  If told by your health care provider, use artificial tears and wear an eye patch to protect your eye until you can close your eyelid again.  Keep all follow-up visits as told by your health care provider. This is important. Get help right away if:  Your pain medicine is not  helping.  You have chills or fever.  Your symptoms get worse.  Your symptoms have not gone away after 2 weeks.  You have any changes in vision. This information is not intended to replace advice given to you by your health care provider. Make sure you discuss any questions you have with your health care provider. Document Released: 09/13/2002 Document Revised: 03/06/2016 Document Reviewed: 01/07/2014 Elsevier Interactive Patient Education  2018 [employer]

## 2017-12-22 ENCOUNTER — Ambulatory Visit: Payer: Self-pay | Admitting: Neurology

## 2019-12-04 ENCOUNTER — Ambulatory Visit: Payer: Self-pay | Attending: Internal Medicine

## 2019-12-04 ENCOUNTER — Other Ambulatory Visit: Payer: Self-pay

## 2019-12-04 DIAGNOSIS — Z23 Encounter for immunization: Secondary | ICD-10-CM | POA: Insufficient documentation

## 2019-12-04 NOTE — Progress Notes (Signed)
   Covid-19 Vaccination Clinic  Name:  Yaneli Keithley    MRN: [id number] DOB: Oct 28, 1970  12/04/2019  Ms. Mckey was observed post Covid-19 immunization for 15 minutes without incidence. She was provided with Vaccine Information Sheet and instruction to access the V-Safe system.   Ms. Lanza was instructed to call 911 with any severe reactions post vaccine: Marland Kitchen Difficulty breathing  . Swelling of your face and throat  . A fast heartbeat  . A bad rash all over your body  . Dizziness and weakness    Immunizations Administered    Name Date Dose VIS Date Route   Pfizer COVID-19 Vaccine 12/04/2019  3:04 PM 0.3 mL 09/17/2019 Intramuscular   Manufacturer: [employer], [employer]   Lot: [id number]   NDC: [address]-4

## 2019-12-25 ENCOUNTER — Ambulatory Visit: Payer: Self-pay | Attending: Internal Medicine

## 2019-12-25 DIAGNOSIS — Z23 Encounter for immunization: Secondary | ICD-10-CM

## 2019-12-25 NOTE — Progress Notes (Signed)
   Covid-19 Vaccination Clinic  Name:  Dawn Pena    MRN: [id number] DOB: 28-Mar-1971  12/25/2019  [name] was observed post Covid-19 immunization for 15 minutes without incident. She was provided with Vaccine Information Sheet and instruction to access the V-Safe system.   [name] was instructed to call 911 with any severe reactions post vaccine: Marland Kitchen Difficulty breathing  . Swelling of face and throat  . A fast heartbeat  . A bad rash all over body  . Dizziness and weakness   Immunizations Administered    Name Date Dose VIS Date Route   Pfizer COVID-19 Vaccine 12/25/2019 12:05 PM 0.3 mL 09/17/2019 Intramuscular   Manufacturer: [employer], [employer]   Lot: [id number]   NDC: [address]-2

## 2020-08-19 ENCOUNTER — Ambulatory Visit (HOSPITAL_COMMUNITY)
Admission: RE | Admit: 2020-08-19 | Discharge: 2020-08-19 | Disposition: A | Discharge status: Home / Self Care | Payer: 59 | Source: Ambulatory Visit | Attending: Family Medicine | Admitting: Family Medicine

## 2020-08-19 ENCOUNTER — Encounter (HOSPITAL_COMMUNITY): Payer: Self-pay

## 2020-08-19 ENCOUNTER — Other Ambulatory Visit: Payer: Self-pay

## 2020-08-19 VITALS — BP 153/114 | HR 115 | Temp 99.1°F | Resp 18

## 2020-08-19 DIAGNOSIS — R519 Headache, unspecified: Secondary | ICD-10-CM | POA: Diagnosis not present

## 2020-08-19 DIAGNOSIS — U071 COVID-19: Secondary | ICD-10-CM

## 2020-08-19 MED ORDER — METOCLOPRAMIDE HCL 5 MG/ML IJ SOLN
5.0000 mg | Freq: Once | INTRAMUSCULAR | Status: AC
  Administered 2020-08-19: 5 mg via INTRAMUSCULAR

## 2020-08-19 MED ORDER — BUTALBITAL-APAP-CAFFEINE 50-325-40 MG PO TABS: 1.0000 | ORAL_TABLET | Freq: Four times a day (QID) | ORAL | 0 refills | Status: AC | PRN

## 2020-08-19 MED ORDER — DEXAMETHASONE SODIUM PHOSPHATE 10 MG/ML IJ SOLN
10.0000 mg | Freq: Once | INTRAMUSCULAR | Status: AC
  Administered 2020-08-19: 10 mg via INTRAMUSCULAR

## 2020-08-19 MED ORDER — NAPROXEN 375 MG PO TABS: 375.0000 mg | ORAL_TABLET | Freq: Two times a day (BID) | ORAL | 0 refills | Status: DC

## 2020-08-19 MED ORDER — KETOROLAC TROMETHAMINE 60 MG/2ML IM SOLN
60.0000 mg | Freq: Once | INTRAMUSCULAR | Status: AC
  Administered 2020-08-19: 60 mg via INTRAMUSCULAR

## 2020-08-19 MED ORDER — TIZANIDINE HCL 4 MG PO TABS: 4.0000 mg | ORAL_TABLET | Freq: Every day | ORAL | 0 refills | Status: DC

## 2020-08-19 MED ORDER — KETOROLAC TROMETHAMINE 60 MG/2ML IM SOLN
INTRAMUSCULAR | Status: AC
  Filled 2020-08-19: qty 2

## 2020-08-19 MED ORDER — TIZANIDINE HCL 4 MG PO TABS: 4.0000 mg | ORAL_TABLET | Freq: Four times a day (QID) | ORAL | 0 refills | Status: DC | PRN

## 2020-08-19 MED ORDER — DEXAMETHASONE SODIUM PHOSPHATE 10 MG/ML IJ SOLN
INTRAMUSCULAR | Status: AC
  Filled 2020-08-19: qty 1

## 2020-08-19 MED ORDER — METOCLOPRAMIDE HCL 5 MG/ML IJ SOLN
INTRAMUSCULAR | Status: AC
  Filled 2020-08-19: qty 2

## 2020-08-19 NOTE — ED Provider Notes (Signed)
[facility]    CSN: [id number] Arrival date & time: 08/19/20  1624      History   Chief Complaint Chief Complaint  Patient presents with  . Migraine    HPI Dawn Pena is a 49 y.o. female.   HPI   Patient diagnosed with COVID-19 on 08/08/2020 presents with a with headache that has been waxing and waning for approximately 1 week.  She endorses some generalized body aches, congestion, cough which is occasionally productive.  She has been taking Tylenol daily for symptoms although the last few days headache has not been relieved with conservative measures.  She is afebrile.  Blood pressure is slightly elevated.  She endorses difficulty sleeping due to headache pain. She is tolerating fluids and food without problems although has not drank very much today. Past Medical History:  Diagnosis Date  . Sciatica     Patient Active Problem List   Diagnosis Date Noted  . Low back pain 02/22/2013    Past Surgical History:  Procedure Laterality Date  . CHOLECYSTECTOMY      OB History   No obstetric history on file.      Home Medications    Prior to Admission medications   Medication Sig Start Date End Date Taking? Authorizing Provider  gabapentin (NEURONTIN) 100 MG capsule Take 1 capsule (100 mg total) by mouth 3 (three) times daily. 09/10/17   Drema Dallas, DO  ibuprofen (ADVIL,MOTRIN) 200 MG tablet Take 400 mg by mouth every 6 (six) hours as needed. For pain     [provider]  predniSONE (DELTASONE) 20 MG tablet 3 tabs po daily x 7 days 09/09/17   Melene Plan, DO  valACYclovir (VALTREX) 1000 MG tablet Take 1 tablet (1,000 mg total) by mouth 3 (three) times daily. 09/09/17   Melene Plan, DO    Family History Family History  Problem Relation Age of Onset  . Hypertension Father   . Hyperlipidemia Father   . Diabetes Neg Hx   . Heart attack Neg Hx   . Sudden death Neg Hx     Social History Social History   Tobacco Use  . Smoking status: Current  Every Day Smoker  . Smokeless tobacco: Never Used  . Tobacco comment: 1/4 ppd  Vaping Use  . Vaping Use: Never used  Substance Use Topics  . Alcohol use: No  . Drug use: No     Allergies   Patient has no known allergies.   Review of Systems Review of Systems Pertinent negatives listed in HPI'  Physical Exam Triage Vital Signs ED Triage Vitals  Enc Vitals Group     BP 08/19/20 1647 (!) 153/114     Pulse Rate 08/19/20 1647 (!) 115     Resp 08/19/20 1647 18     Temp 08/19/20 1647 99.1 F (37.3 C)     Temp Source 08/19/20 1647 Oral     SpO2 08/19/20 1647 99 %     Weight --      Height --      Head Circumference --      Peak Flow --      Pain Score 08/19/20 1651 9     Pain Loc --      Pain Edu? --      Excl. in GC? --    No data found.  Updated Vital Signs BP (!) 153/114 (BP Location: Right Arm)   Pulse (!) 115   Temp 99.1 F (37.3 C) (Oral)  Resp 18   SpO2 99%   Visual Acuity Right Eye Distance:   Left Eye Distance:   Bilateral Distance:    Right Eye Near:   Left Eye Near:    Bilateral Near:     Physical Exam Constitutional:      General: She is not in acute distress.    Appearance: She is obese. She is ill-appearing. She is not toxic-appearing.  Eyes:     Pupils: Pupils are equal, round, and reactive to light.     Right eye: Pupil is round, reactive and not sluggish.     Left eye: Pupil is round, reactive and not sluggish.     Funduscopic exam:    Right eye: No hemorrhage or exudate.        Left eye: No hemorrhage or exudate.  Cardiovascular:     Rate and Rhythm: Regular rhythm. Tachycardia present.  Musculoskeletal:     Cervical back: Normal range of motion.  Neurological:     Mental Status: She is alert.     GCS: GCS eye subscore is 4. GCS verbal subscore is 5. GCS motor subscore is 6.     Sensory: Sensation is intact.     Motor: Motor function is intact.     Coordination: Coordination is intact.     Gait: Gait is intact.      UC  Treatments / Results  Labs (all labs ordered are listed, but only abnormal results are displayed) Labs Reviewed - No data to display  EKG   Radiology No results found.  Procedures Procedures (including critical care time)  Medications Ordered in UC Medications - No data to display  Initial Impression / Assessment and Plan / UC Course  I have reviewed the triage vital signs and the nursing notes.  Pertinent labs & imaging results that were available during my care of the patient were reviewed by me and considered in my medical decision making (see chart for details).    Patient tolerated headache cocktail including Decadron and Reglan.  Patient was discharged home with Fioricet along with tizanidine to take as needed for headache related pain.  Also recommended discontinuing any Tylenol or regular ibuprofen.  Prescribed naproxen to take once every 12 hours as needed.  Red flags discussed that warrant immediate follow-up evaluation in the ER.  Patient verbalized understanding and agreed with plan.  Final Clinical Impressions(s) / UC Diagnoses   Final diagnoses:  Generalized headache  COVID-19 virus infection     Discharge Instructions     For headache pain you received today Toradol, Decadron, and Reglan.  I am discharging home with Fioricet which is specifically for headache related pain you can take 1 tablet every 6 hours or 2 3 times daily.  I recommend also taking naproxen 1 tablet every 12 hours as needed.  Tizanidine is helpful for sleep recommend taking at bedtime for relief of neck and generalized body aches and may be helpful in relieving headache.  If any of your symptoms worsen or do not improve with treatment received here in clinic today I do recommend immediate evaluation in the setting of the emergency department.    ED Prescriptions    Medication Sig Dispense Auth. Provider   tiZANidine (ZANAFLEX) 4 MG tablet  (Status: Discontinued) Take 1 tablet (4 mg total) by  mouth every 6 (six) hours as needed for muscle spasms. 30 tablet Bing Neighbors, FNP   naproxen (NAPROSYN) 375 MG tablet Take 1 tablet (375 mg total) by  mouth 2 (two) times daily. 20 tablet Bing Neighbors, FNP   butalbital-acetaminophen-caffeine (FIORICET) 50-325-40 MG tablet Take 1 tablet by mouth every 6 (six) hours as needed for headache. 20 tablet Bing Neighbors, FNP   tiZANidine (ZANAFLEX) 4 MG tablet Take 1 tablet (4 mg total) by mouth at bedtime. 30 tablet Bing Neighbors, FNP     PDMP not reviewed this encounter.   Bing Neighbors, FNP 08/22/20 1326

## 2020-08-19 NOTE — Discharge Instructions (Signed)
For headache pain you received today Toradol, Decadron, and Reglan.  I am discharging home with Fioricet which is specifically for headache related pain you can take 1 tablet every 6 hours or 2 3 times daily.  I recommend also taking naproxen 1 tablet every 12 hours as needed.  Tizanidine is helpful for sleep recommend taking at bedtime for relief of neck and generalized body aches and may be helpful in relieving headache.  If any of your symptoms worsen or do not improve with treatment received here in clinic today I do recommend immediate evaluation in the setting of the emergency department.

## 2020-08-19 NOTE — ED Triage Notes (Signed)
Pt present positive covid test, pt states having a severe headache that starts at the front right hand side and moves toward the back of her head. Pt states tried OTC medication with no relief.

## 2020-10-20 ENCOUNTER — Ambulatory Visit (HOSPITAL_COMMUNITY): Payer: Self-pay

## 2021-11-05 ENCOUNTER — Other Ambulatory Visit: Payer: Self-pay

## 2021-11-05 ENCOUNTER — Ambulatory Visit
Admission: EM | Admit: 2021-11-05 | Discharge: 2021-11-05 | Disposition: A | Discharge status: Home / Self Care | Payer: 59 | Attending: Family Medicine | Admitting: Family Medicine

## 2021-11-05 DIAGNOSIS — J029 Acute pharyngitis, unspecified: Secondary | ICD-10-CM | POA: Insufficient documentation

## 2021-11-05 DIAGNOSIS — J069 Acute upper respiratory infection, unspecified: Secondary | ICD-10-CM | POA: Insufficient documentation

## 2021-11-05 LAB — POCT RAPID STREP A (OFFICE): Rapid Strep A Screen: NEGATIVE

## 2021-11-05 NOTE — Discharge Instructions (Signed)
Strep test was negative You have been swabbed for COVID, and the test will result in the next 24 hours. Our staff will call you if positive. If the test is positive, you should quarantine for 5 days.   It is ok to continue dayquil/nyquil. Drink plenty of fluids and rest.

## 2021-11-05 NOTE — ED Triage Notes (Signed)
Pt present coughing, headache and sore throat. Symptoms started yesterday. Pt took an at home covid test results were negative. Pt took otc medication with no relief.

## 2021-11-05 NOTE — ED Provider Notes (Addendum)
[facility]    CSN: [id number] Arrival date & time: 11/05/21  1014      History   Chief Complaint Chief Complaint  Patient presents with   Sore Throat   Headache   Nasal Congestion    HPI Dawn Pena is a 51 y.o. female.    Sore Throat Associated symptoms include headaches.  Headache Here for sore throat (her predominantly worst symptom at present), a little cough, and nasal congestion since yesterday. This AM she felt worse and had temp to 100.9. She has done a couple of home covid tests, both negative.  She did have some symptoms last week of a little fatigue that improved, and one day had some mild sore throat, that also improved after a day. These symptoms were 6-7 days ago. She had felt pretty good then, until yesterday.  Past Medical History:  Diagnosis Date   Sciatica     Patient Active Problem List   Diagnosis Date Noted   Low back pain 02/22/2013    Past Surgical History:  Procedure Laterality Date   CHOLECYSTECTOMY      OB History   No obstetric history on file.      Home Medications    Prior to Admission medications   Medication Sig Start Date End Date Taking? Authorizing Provider  gabapentin (NEURONTIN) 100 MG capsule Take 1 capsule (100 mg total) by mouth 3 (three) times daily. 09/10/17   Drema Dallas, DO  valACYclovir (VALTREX) 1000 MG tablet Take 1 tablet (1,000 mg total) by mouth 3 (three) times daily. 09/09/17   Melene Plan, DO    Family History Family History  Problem Relation Age of Onset   Hypertension Father    Hyperlipidemia Father    Diabetes Neg Hx    Heart attack Neg Hx    Sudden death Neg Hx     Social History Social History   Tobacco Use   Smoking status: Every Day   Smokeless tobacco: Never   Tobacco comments:    1/4 ppd  Vaping Use   Vaping Use: Never used  Substance Use Topics   Alcohol use: No   Drug use: No     Allergies   Patient has no known allergies.   Review of Systems Review of  Systems  Neurological:  Positive for headaches.    Physical Exam Triage Vital Signs ED Triage Vitals  Enc Vitals Group     BP 11/05/21 1025 126/84     Pulse Rate 11/05/21 1025 100     Resp 11/05/21 1025 18     Temp 11/05/21 1025 99.9 F (37.7 C)     Temp Source 11/05/21 1025 Oral     SpO2 11/05/21 1025 95 %     Weight --      Height --      Head Circumference --      Peak Flow --      Pain Score 11/05/21 1026 9     Pain Loc --      Pain Edu? --      Excl. in GC? --    No data found.  Updated Vital Signs BP 126/84 (BP Location: Left Arm)    Pulse 100    Temp 99.9 F (37.7 C) (Oral)    Resp 18    SpO2 95%   Visual Acuity Right Eye Distance:   Left Eye Distance:   Bilateral Distance:    Right Eye Near:   Left Eye Near:  Bilateral Near:     Physical Exam   UC Treatments / Results  Labs (all labs ordered are listed, but only abnormal results are displayed) Labs Reviewed  CULTURE, GROUP A STREP ([facility])  COVID-19, FLU A+B NAA  POCT RAPID STREP A (OFFICE)    EKG   Radiology No results found.  Procedures Procedures (including critical care time)  Medications Ordered in UC Medications - No data to display  Initial Impression / Assessment and Plan / UC Course  I have reviewed the triage vital signs and the nursing notes.  Pertinent labs & imaging results that were available during my care of the patient were reviewed by me and considered in my medical decision making (see chart for details).     Rapid strep neg; c/s sent.  Her BMI is elevated; if COVID test is positive, she should be prescribed paxlovid. Today is day 2 of symptoms.   If her flu test is positive, I think she would benefit from tamiflu. Final Clinical Impressions(s) / UC Diagnoses   Final diagnoses:  Acute upper respiratory infection  Sore throat     Discharge Instructions      Strep test was negative You have been swabbed for COVID, and the test will result in the next 24  hours. Our staff will call you if positive. If the test is positive, you should quarantine for 5 days.   It is ok to continue dayquil/nyquil. Drink plenty of fluids and rest.     ED Prescriptions   None    PDMP not reviewed this encounter.   Zenia Resides, MD 11/05/21 1111    Zenia Resides, MD 11/05/21 1113

## 2021-11-07 LAB — COVID-19, FLU A+B NAA
Influenza A, NAA: NOT DETECTED
Influenza B, NAA: NOT DETECTED
SARS-CoV-2, NAA: NOT DETECTED

## 2021-11-08 LAB — CULTURE, GROUP A STREP (THRC)

## 2023-05-05 ENCOUNTER — Emergency Department (HOSPITAL_BASED_OUTPATIENT_CLINIC_OR_DEPARTMENT_OTHER)
Admission: EM | Admit: 2023-05-05 | Discharge: 2023-05-05 | Disposition: A | Discharge status: Home / Self Care | Payer: BC Managed Care – PPO | Attending: Emergency Medicine | Admitting: Emergency Medicine

## 2023-05-05 ENCOUNTER — Other Ambulatory Visit: Payer: Self-pay

## 2023-05-05 ENCOUNTER — Encounter (HOSPITAL_BASED_OUTPATIENT_CLINIC_OR_DEPARTMENT_OTHER): Payer: Self-pay

## 2023-05-05 ENCOUNTER — Other Ambulatory Visit (HOSPITAL_BASED_OUTPATIENT_CLINIC_OR_DEPARTMENT_OTHER): Payer: Self-pay

## 2023-05-05 DIAGNOSIS — N611 Abscess of the breast and nipple: Secondary | ICD-10-CM | POA: Diagnosis present

## 2023-05-05 DIAGNOSIS — E119 Type 2 diabetes mellitus without complications: Secondary | ICD-10-CM | POA: Insufficient documentation

## 2023-05-05 MED ORDER — CEFTRIAXONE SODIUM 1 G IJ SOLR
1.0000 g | Freq: Once | INTRAMUSCULAR | Status: AC
  Administered 2023-05-05: 1 g via INTRAMUSCULAR
  Filled 2023-05-05: qty 10

## 2023-05-05 MED ORDER — LIDOCAINE HCL (PF) 1 % IJ SOLN
1.0000 mL | Freq: Once | INTRAMUSCULAR | Status: AC
  Administered 2023-05-05: 1 mL
  Filled 2023-05-05: qty 5

## 2023-05-05 MED ORDER — SULFAMETHOXAZOLE-TRIMETHOPRIM 800-160 MG PO TABS
1.0000 | ORAL_TABLET | Freq: Two times a day (BID) | ORAL | 0 refills | Status: AC
  Filled 2023-05-05: qty 20, 10d supply, fill #0

## 2023-05-05 NOTE — Discharge Instructions (Signed)
Warm compresses 20 minutes 4 times a day  

## 2023-05-05 NOTE — ED Provider Notes (Signed)
[facility] EMERGENC[facility] DEPARTMENT AT [facility] Provider Note   CSN: [id number] Arrival date & time: 05/05/23  4098     History  Chief Complaint  Patient presents with   Cyst    Dawn Pena is a 52 y.o. female.  Patient complains of an area of infection under her right breast breast.  Patient noticed swelling 3 days ago.  Patient reports she has had a fever on and off.  Patient denies any drainage.  Patient denies any past medical history of diabetes.  She has never had a mammogram.  She does not currently have a primary care physician.  The history is provided by the patient. No language interpreter was used.       Home Medications Prior to Admission medications   Medication Sig Start Date End Date Taking? Authorizing Provider  sulfamethoxazole-trimethoprim (BACTRIM DS) 800-160 MG tablet Take 1 tablet by mouth 2 (two) times daily. 05/05/23  Yes Cheron Schaumann K, PA-C  gabapentin (NEURONTIN) 100 MG capsule Take 1 capsule (100 mg total) by mouth 3 (three) times daily. 09/10/17   Drema Dallas, DO  valACYclovir (VALTREX) 1000 MG tablet Take 1 tablet (1,000 mg total) by mouth 3 (three) times daily. 09/09/17   Melene Plan, DO      Allergies    Patient has no known allergies.    Review of Systems   Review of Systems  Skin:  Positive for color change.  All other systems reviewed and are negative.   Physical Exam Updated Vital Signs BP 123/77   Pulse 78   Temp 98.5 F (36.9 C) (Oral)   Resp 18   Ht 5\' 2"  (1.575 m)   Wt 111.1 kg   SpO2 97%   BMI 44.81 kg/m  Physical Exam Vitals and nursing note reviewed.  Constitutional:      Appearance: She is well-developed.  HENT:     Head: Normocephalic.  Cardiovascular:     Rate and Rhythm: Normal rate.  Pulmonary:     Effort: Pulmonary effort is normal.  Abdominal:     General: There is no distension.  Musculoskeletal:        General: Normal range of motion.     Cervical back: Normal range of motion.  Skin:     General: Skin is warm.     Comments: 2 cm area of redness underside of right breast.  No fluctuance, tender to palpation, no streaking  Neurological:     General: No focal deficit present.     Mental Status: She is alert and oriented to person, place, and time.     ED Results / Procedures / Treatments   Labs (all labs ordered are listed, but only abnormal results are displayed) Labs Reviewed - No data to display  EKG None  Radiology No results found.  Procedures Procedures    Medications Ordered in ED Medications  cefTRIAXone (ROCEPHIN) injection 1 g (1 g Intramuscular Given 05/05/23 1449)  lidocaine (PF) (XYLOCAINE) 1 % injection 1 mL (1 mL Other Given 05/05/23 1449)    ED Course/ Medical Decision Making/ A&P                             Medical Decision Making Patient complains of a red swollen area to her right breast  Risk Prescription drug management. Risk Details: Patient is given an injection of Rocephin 1 g IM.  Patient is given a prescription for Bactrim.  Patient is  referred to family practice for follow-up and the breast center for ultrasound.  I reviewed the breast abscess protocol.  She is advised she should return if fever chills or worsening symptoms.           Final Clinical Impression(s) / ED Diagnoses Final diagnoses:  Abscess of breast, right    Rx / DC Orders ED Discharge Orders          Ordered    sulfamethoxazole-trimethoprim (BACTRIM DS) 800-160 MG tablet  2 times daily        05/05/23 1436          An After Visit Summary was printed and given to the patient.     Osie Cheeks 05/05/23 1710    Alvira Monday, MD 05/09/23 2113

## 2023-05-05 NOTE — ED Triage Notes (Signed)
Pt presents with c/o cyst under right breast, starting Friday, redness spread overnight, fever last night. Tylenol dose this morning at 0830. Denies drainage at this point.

## 2023-05-05 NOTE — ED Notes (Signed)
Discharge instructions, follow up care, and prescriptions reviewed and explained, pt verbalized understanding and had no further questions on d/c. Pt caox4, ambulatory, NAD on d/c.  

## 2023-08-09 ENCOUNTER — Emergency Department (HOSPITAL_BASED_OUTPATIENT_CLINIC_OR_DEPARTMENT_OTHER)
Admission: EM | Admit: 2023-08-09 | Discharge: 2023-08-10 | Disposition: A | Discharge status: Home / Self Care | Payer: BC Managed Care – PPO | Attending: Emergency Medicine | Admitting: Emergency Medicine

## 2023-08-09 ENCOUNTER — Encounter (HOSPITAL_BASED_OUTPATIENT_CLINIC_OR_DEPARTMENT_OTHER): Payer: Self-pay | Admitting: Urology

## 2023-08-09 ENCOUNTER — Other Ambulatory Visit: Payer: Self-pay

## 2023-08-09 DIAGNOSIS — R109 Unspecified abdominal pain: Secondary | ICD-10-CM | POA: Insufficient documentation

## 2023-08-09 DIAGNOSIS — N200 Calculus of kidney: Secondary | ICD-10-CM

## 2023-08-09 LAB — BASIC METABOLIC PANEL
Anion gap: 11 (ref 5–15)
BUN: 14 mg/dL (ref 6–20)
CO2: 20 mmol/L — ABNORMAL LOW (ref 22–32)
Calcium: 8.9 mg/dL (ref 8.9–10.3)
Chloride: 105 mmol/L (ref 98–111)
Creatinine, Ser: 0.91 mg/dL (ref 0.44–1.00)
GFR, Estimated: >60 mL/min (ref >60)
Glucose, Bld: 149 mg/dL — ABNORMAL HIGH (ref 70–99)
Potassium: 4.2 mmol/L (ref 3.5–5.1)
Sodium: 136 mmol/L (ref 135–145)

## 2023-08-09 LAB — CBC
HCT: 44 % (ref 36.0–46.0)
Hemoglobin: 14.6 g/dL (ref 12.0–15.0)
MCH: 29.1 pg (ref 26.0–34.0)
MCHC: 33.2 g/dL (ref 30.0–36.0)
MCV: 87.8 fL (ref 80.0–100.0)
Platelets: 337 10*3/uL (ref 150–400)
RBC: 5.01 MIL/uL (ref 3.87–5.11)
RDW: 13.9 % (ref 11.5–15.5)
WBC: 9.7 10*3/uL (ref 4.0–10.5)
nRBC: 0 % (ref 0.0–0.2)

## 2023-08-09 MED ORDER — HYDROMORPHONE HCL 1 MG/ML IJ SOLN
1.0000 mg | Freq: Once | INTRAMUSCULAR | Status: AC
  Administered 2023-08-09: 1 mg via INTRAVENOUS
  Filled 2023-08-09: qty 1

## 2023-08-09 MED ORDER — ONDANSETRON HCL 4 MG/2ML IJ SOLN
4.0000 mg | Freq: Once | INTRAMUSCULAR | Status: AC
  Administered 2023-08-09: 4 mg via INTRAVENOUS
  Filled 2023-08-09: qty 2

## 2023-08-09 NOTE — ED Triage Notes (Addendum)
Pt states left sided flank pain that started Thursday morning and has been intermittent  Pt in severe pain, extremely diaphoretic Reports some urinary symptoms  H/o kidney stone   Dr. Donnald Garre notified of pt status and verbal orders given

## 2023-08-09 NOTE — ED Provider Notes (Signed)
[facility] EMERGENCY DEPARTMENT AT [facility] Provider Note   CSN: [id number] Arrival date & time: 08/09/23  2157     History  Chief Complaint  Patient presents with   Flank Pain    Dawn Pena is a 52 y.o. female.  HPI Reports a distant history of a kidney stone.  She reports 3 days ago she awakened with a pretty severe pain in her left flank rating around to the front.  She took a hot bath and things seem to improve enough that she was able to go to work.  She reports has been low-level pain but tolerable and then tonight it got severely worse again.  She reports it is a sharp pain again radiating from her flank down to her lower abdomen.  Patient reports she saw some pink-tinged urine with wiping.  She has not had any fever or chills but reports that the pain was so severe that she got sweaty this evening.    Home Medications Prior to Admission medications   Medication Sig Start Date End Date Taking? Authorizing Provider  gabapentin (NEURONTIN) 100 MG capsule Take 1 capsule (100 mg total) by mouth 3 (three) times daily. 09/10/17   Drema Dallas, DO  sulfamethoxazole-trimethoprim (BACTRIM DS) 800-160 MG tablet Take 1 tablet by mouth 2 (two) times daily. 05/05/23   Elson Areas, PA-C  valACYclovir (VALTREX) 1000 MG tablet Take 1 tablet (1,000 mg total) by mouth 3 (three) times daily. 09/09/17   Melene Plan, DO      Allergies    Patient has no known allergies.    Review of Systems   Review of Systems  Physical Exam Updated Vital Signs BP (!) 107/94 (BP Location: Left Arm)   Pulse 64   Temp 98.7 F (37.1 C)   Resp (!) 22   Ht 5\' 2"  (1.575 m)   Wt 111.1 kg   SpO2 93%   BMI 44.80 kg/m  Physical Exam Constitutional:      Comments: Alert nontoxic.  Mental status clear.  No acute distress.  Respirations unlabored.  Patient is examined after first dose of pain control.  She reports pain is much improved.  HENT:     Mouth/Throat:     Pharynx: Oropharynx is  clear.  Eyes:     Extraocular Movements: Extraocular movements intact.  Cardiovascular:     Rate and Rhythm: Normal rate and regular rhythm.  Pulmonary:     Effort: Pulmonary effort is normal.     Breath sounds: Normal breath sounds.  Abdominal:     General: There is no distension.     Palpations: Abdomen is soft.     Tenderness: There is no abdominal tenderness. There is no guarding.     Comments: No CVA tenderness to percussion  Musculoskeletal:        General: No swelling or tenderness. Normal range of motion.     Right lower leg: No edema.     Left lower leg: No edema.  Skin:    General: Skin is warm and dry.  Neurological:     General: No focal deficit present.     Mental Status: She is oriented to person, place, and time.     Motor: No weakness.     Coordination: Coordination normal.  Psychiatric:        Mood and Affect: Mood normal.     ED Results / Procedures / Treatments   Labs (all labs ordered are listed, but only abnormal results  are displayed) Labs Reviewed  BASIC METABOLIC PANEL - Abnormal; Notable for the following components:      Result Value   CO2 20 (*)    Glucose, Bld 149 (*)    All other components within normal limits  CBC  URINALYSIS, ROUTINE W REFLEX MICROSCOPIC  PREGNANCY, URINE    EKG None  Radiology No results found.  Procedures Procedures    Medications Ordered in ED Medications  HYDROmorphone (DILAUDID) injection 1 mg (1 mg Intravenous Given 08/09/23 2222)  ondansetron (ZOFRAN) injection 4 mg (4 mg Intravenous Given 08/09/23 2223)    ED Course/ Medical Decision Making/ A&P Clinical Course as of 08/15/23 1450  Sun Aug 10, 2023  0126 I personally viewed the images from radiology studies and agree with radiologist interpretation: CT with small distal UVJ stone. Pain is resolved at present. Awaiting UA.  [CS]  0218 UA with blood, but no significant signs of infection. Pain is well controlled. Plan discharge with Rx for Naprosyn,  Norco, Flomax and Urology follow up. Strainer provided.  [CS]    Clinical Course User Index [CS] Pollyann Savoy, MD                                 Medical Decision Making Amount and/or Complexity of Data Reviewed Labs: ordered. Radiology: ordered.  Risk Prescription drug management.   Patient presents with left flank pain.  She has history of kidney stone.  This sounds high probability for kidney stone.  Will proceed with diagnostic evaluation.  Patient good good pain control with 1 mg of Dilaudid.  CT pending.  Renal function normal.  CBC normal.  Urinalysis pending.  CT pending.  Dr. Bernette Mayers assumes care at shift change.  High suspicion for kidney stone.  Patient appears to be in good condition otherwise.  Will need to rule out any UTI in association with kidney stone.  If no UTI and no emergent findings and patient is pain control, anticipate she will be stable for discharge.       Final Clinical Impression(s) / ED Diagnoses Final diagnoses:  Left flank pain    Rx / DC Orders ED Discharge Orders     None         Arby Barrette, MD 08/15/23 1451

## 2023-08-10 ENCOUNTER — Emergency Department (HOSPITAL_BASED_OUTPATIENT_CLINIC_OR_DEPARTMENT_OTHER): Payer: BC Managed Care – PPO

## 2023-08-10 LAB — URINALYSIS, MICROSCOPIC (REFLEX): RBC / HPF: >50 RBC/hpf (ref 0–5)

## 2023-08-10 LAB — URINALYSIS, ROUTINE W REFLEX MICROSCOPIC
Glucose, UA: NEGATIVE mg/dL
Ketones, ur: NEGATIVE mg/dL
Leukocytes,Ua: NEGATIVE
Nitrite: NEGATIVE
Protein, ur: 100 mg/dL — AB
Specific Gravity, Urine: >=1.03 (ref 1.005–1.030)
pH: 5.5 (ref 5.0–8.0)

## 2023-08-10 LAB — PREGNANCY, URINE: Preg Test, Ur: NEGATIVE

## 2023-08-10 MED ORDER — KETOROLAC TROMETHAMINE 15 MG/ML IJ SOLN: 15.0000 mg | Freq: Once | INTRAMUSCULAR | Status: AC

## 2023-08-10 MED ORDER — ONDANSETRON HCL 4 MG/2ML IJ SOLN
INTRAMUSCULAR | Status: AC
  Administered 2023-08-10: 4 mg via INTRAVENOUS
  Filled 2023-08-10: qty 2

## 2023-08-10 MED ORDER — TAMSULOSIN HCL 0.4 MG PO CAPS: 0.4000 mg | ORAL_CAPSULE | Freq: Every day | ORAL | 0 refills | Status: AC

## 2023-08-10 MED ORDER — KETOROLAC TROMETHAMINE 15 MG/ML IJ SOLN
INTRAMUSCULAR | Status: AC
  Administered 2023-08-10: 15 mg via INTRAVENOUS
  Filled 2023-08-10: qty 1

## 2023-08-10 MED ORDER — NAPROXEN 500 MG PO TABS: 500.0000 mg | ORAL_TABLET | Freq: Two times a day (BID) | ORAL | 0 refills | Status: AC

## 2023-08-10 MED ORDER — HYDROCODONE-ACETAMINOPHEN 5-325 MG PO TABS: 1.0000 | ORAL_TABLET | Freq: Four times a day (QID) | ORAL | 0 refills | Status: AC | PRN

## 2023-08-10 MED ORDER — ONDANSETRON HCL 4 MG/2ML IJ SOLN: 4.0000 mg | Freq: Once | INTRAMUSCULAR | Status: AC

## 2023-08-10 NOTE — ED Provider Notes (Signed)
Care assumed at shift change. Here with flank pain, concern for stone. Awaiting UA and CT.  Physical Exam  BP (!) 107/94 (BP Location: Left Arm)   Pulse 64   Temp 98.7 F (37.1 C)   Resp (!) 22   Ht 5\' 2"  (1.575 m)   Wt 111.1 kg   SpO2 93%   BMI 44.80 kg/m   Physical Exam  Procedures  Procedures  ED Course / MDM   Clinical Course as of 08/10/23 0220  Wynelle Link Aug 10, 2023  0126 I personally viewed the images from radiology studies and agree with radiologist interpretation: CT with small distal UVJ stone. Pain is resolved at present. Awaiting UA.  [CS]  0218 UA with blood, but no significant signs of infection. Pain is well controlled. Plan discharge with Rx for Naprosyn, Norco, Flomax and Urology follow up. Strainer provided.  [CS]    Clinical Course User Index [CS] Pollyann Savoy, MD   Medical Decision Making Problems Addressed: Kidney stone: acute illness or injury  Amount and/or Complexity of Data Reviewed Labs: ordered. Decision-making details documented in ED Course. Radiology: ordered and independent interpretation performed. Decision-making details documented in ED Course.  Risk Prescription drug management. Parenteral controlled substances.          Pollyann Savoy, MD 08/10/23 [phone]
